# Patient Record
Sex: Female | Born: 1995 | Race: Black or African American | Hispanic: No | Marital: Single | Smoking: Never smoker
Health system: Southern US, Community
[De-identification: ages and names within clinical notes are randomized; demographics above are authoritative.]

## PROBLEM LIST (undated history)

## (undated) ENCOUNTER — Inpatient Hospital Stay (HOSPITAL_COMMUNITY): Payer: Self-pay

## (undated) DIAGNOSIS — D649 Anemia, unspecified: Secondary | ICD-10-CM

## (undated) HISTORY — PX: MOUTH SURGERY: SHX715

---
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
## 2014-08-23 ENCOUNTER — Encounter (HOSPITAL_COMMUNITY): Payer: Self-pay | Admitting: *Deleted

## 2014-08-23 ENCOUNTER — Emergency Department (HOSPITAL_COMMUNITY)
Admission: EM | Admit: 2014-08-23 | Discharge: 2014-08-24 | Disposition: A | Discharge status: Home / Self Care | Payer: Medicaid Other | Attending: Emergency Medicine | Admitting: Emergency Medicine

## 2014-08-23 DIAGNOSIS — N3090 Cystitis, unspecified without hematuria: Secondary | ICD-10-CM | POA: Diagnosis not present

## 2014-08-23 DIAGNOSIS — Z79899 Other long term (current) drug therapy: Secondary | ICD-10-CM | POA: Diagnosis not present

## 2014-08-23 DIAGNOSIS — R112 Nausea with vomiting, unspecified: Secondary | ICD-10-CM | POA: Diagnosis present

## 2014-08-23 DIAGNOSIS — Z880 Allergy status to penicillin: Secondary | ICD-10-CM | POA: Insufficient documentation

## 2014-08-23 DIAGNOSIS — D649 Anemia, unspecified: Secondary | ICD-10-CM | POA: Diagnosis not present

## 2014-08-23 DIAGNOSIS — R42 Dizziness and giddiness: Secondary | ICD-10-CM | POA: Insufficient documentation

## 2014-08-23 HISTORY — DX: Anemia, unspecified: D64.9

## 2014-08-23 NOTE — ED Notes (Signed)
Pt complains of pain in her hips radiating down to both knees. Pt also complains of nausea for the past 2 days. Pt states the nausea started after she went to the doctor 2 days ago and had blood drawn. Pt states she has thrown up 3 times today, denies diarrhea.

## 2014-08-24 LAB — CBC WITH DIFFERENTIAL/PLATELET
Basophils Absolute: 0 10*3/uL (ref 0.0–0.1)
Basophils Relative: 0 % (ref 0–1)
EOS ABS: 0.2 10*3/uL (ref 0.0–0.7)
Eosinophils Relative: 2 % (ref 0–5)
HEMATOCRIT: 39.7 % (ref 36.0–46.0)
Hemoglobin: 13.1 g/dL (ref 12.0–15.0)
LYMPHS PCT: 34 % (ref 12–46)
Lymphs Abs: 3.5 10*3/uL (ref 0.7–4.0)
MCH: 27.9 pg (ref 26.0–34.0)
MCHC: 33 g/dL (ref 30.0–36.0)
MCV: 84.5 fL (ref 78.0–100.0)
MONO ABS: 0.6 10*3/uL (ref 0.1–1.0)
Monocytes Relative: 6 % (ref 3–12)
NEUTROS ABS: 6.1 10*3/uL (ref 1.7–7.7)
Neutrophils Relative %: 58 % (ref 43–77)
Platelets: 308 10*3/uL (ref 150–400)
RBC: 4.7 MIL/uL (ref 3.87–5.11)
RDW: 12.7 % (ref 11.5–15.5)
WBC: 10.4 10*3/uL (ref 4.0–10.5)

## 2014-08-24 LAB — BASIC METABOLIC PANEL
Anion gap: 7 (ref 5–15)
BUN: 11 mg/dL (ref 6–20)
CALCIUM: 9.7 mg/dL (ref 8.9–10.3)
CO2: 26 mmol/L (ref 22–32)
CREATININE: 0.71 mg/dL (ref 0.44–1.00)
Chloride: 106 mmol/L (ref 101–111)
GFR calc Af Amer: >60 mL/min (ref >60)
Glucose, Bld: 81 mg/dL (ref 65–99)
Potassium: 3.9 mmol/L (ref 3.5–5.1)
SODIUM: 139 mmol/L (ref 135–145)

## 2014-08-24 LAB — URINE MICROSCOPIC-ADD ON

## 2014-08-24 LAB — URINALYSIS, ROUTINE W REFLEX MICROSCOPIC
BILIRUBIN URINE: NEGATIVE
GLUCOSE, UA: NEGATIVE mg/dL
Ketones, ur: NEGATIVE mg/dL
Nitrite: POSITIVE — AB
PH: 7.5 (ref 5.0–8.0)
PROTEIN: 30 mg/dL — AB
SPECIFIC GRAVITY, URINE: 1.021 (ref 1.005–1.030)
UROBILINOGEN UA: 1 mg/dL (ref 0.0–1.0)

## 2014-08-24 MED ORDER — ONDANSETRON 4 MG PO TBDP: ORAL_TABLET | ORAL | Status: DC

## 2014-08-24 MED ORDER — SODIUM CHLORIDE 0.9 % IV BOLUS (SEPSIS)
1000.0000 mL | Freq: Once | INTRAVENOUS | Status: AC
  Administered 2014-08-24: 1000 mL via INTRAVENOUS

## 2014-08-24 MED ORDER — CEPHALEXIN 500 MG PO CAPS: 500.0000 mg | ORAL_CAPSULE | Freq: Four times a day (QID) | ORAL | Status: DC

## 2014-08-24 NOTE — ED Notes (Signed)
Patient reports she has been intermittently nauseated x 5 days and began vomiting today.

## 2014-08-24 NOTE — ED Notes (Signed)
Patient eating a sandwich.  Denies nausea at this time.

## 2014-08-24 NOTE — ED Notes (Signed)
Dr. Gentry at bedside. 

## 2014-08-24 NOTE — Discharge Instructions (Signed)

## 2014-08-24 NOTE — ED Provider Notes (Signed)
CSN: [id number]     Arrival date & time 08/23/14  2240 History  This chart was scribed for Mirian Mo, MD by Evon Slack, ED Scribe. This patient was seen in room WA17/WA17 and the patient's care was started at 1:04 AM.     Chief Complaint  Patient presents with  . Leg Pain  . Nausea   Patient is a 19 y.o. female presenting with leg pain. The history is provided by the patient. No language interpreter was used.  Leg Pain Location:  Hip Time since incident:  1 day Injury: no   Hip location:  L hip and R hip Pain details:    Radiates to:  R leg and L leg   Severity:  Mild   Timing:  Constant Chronicity:  New Dislocation: no   Foreign body present:  No foreign bodies Relieved by:  None tried Associated symptoms: no fever    HPI Comments: Shannon Holland is a 19 y.o. female who presents to the Emergency Department complaining of bilateral hip pain onset today. Pt states that the pain is radiating down to her knees. Pt reports subjective fever yesterday that has resolved with OTC medication. Pt reports nausea, vomiting and light headedness. Pt reports Hx of anemia due to low iron. She states that takes an iron supplement daily but has recenlty ran out 5 days ago. Denies diarrhea, constipation, dysuria, vaginal bleeding or vaginal discharge. Pt denies any recent sick contacts.     Past Medical History  Diagnosis Date  . Anemia    History reviewed. No pertinent past surgical history. No family history on file. History  Substance Use Topics  . Smoking status: Never Smoker   . Smokeless tobacco: Not on file  . Alcohol Use: No   OB History    No data available     Review of Systems  Constitutional: Negative for fever.  Gastrointestinal: Positive for nausea and vomiting. Negative for diarrhea and constipation.  Genitourinary: Negative for dysuria, vaginal bleeding and vaginal discharge.  Musculoskeletal: Positive for arthralgias.  Neurological: Positive for  light-headedness.  All other systems reviewed and are negative.     Allergies  Shrimp and Penicillins  Home Medications   Prior to Admission medications   Medication Sig Start Date End Date Taking? Authorizing Provider  cholecalciferol (VITAMIN D) 1000 UNITS tablet Take 1,000 Units by mouth daily.   Yes Historical Provider, MD  ferrous fumarate (HEMOCYTE - 106 MG FE) 325 (106 FE) MG TABS tablet Take 1 tablet by mouth 2 (two) times daily.   Yes Historical Provider, MD  Multiple Vitamin (MULTIVITAMIN WITH MINERALS) TABS tablet Take 1 tablet by mouth daily.   Yes Historical Provider, MD  omeprazole (PRILOSEC) 20 MG capsule Take 20 mg by mouth daily.   Yes Historical Provider, MD  OVER THE COUNTER MEDICATION Take 1 tablet by mouth daily. Herbal energy   Yes Historical Provider, MD  vitamin E 100 UNIT capsule Take 100 Units by mouth daily.   Yes Historical Provider, MD  cephALEXin (KEFLEX) 500 MG capsule Take 1 capsule (500 mg total) by mouth 4 (four) times daily. 08/24/14   Mirian Mo, MD  ondansetron (ZOFRAN ODT) 4 MG disintegrating tablet  ODT q4 hours prn nausea/vomit 08/24/14   Mirian Mo, MD   BP 114/77 mmHg  Pulse 69  Temp(Src) 98.5 F (36.9 C) (Oral)  Resp 18  SpO2 100%  LMP 08/16/2014   Physical Exam  Constitutional: She is oriented to person, place, and time. She  appears well-developed and well-nourished.  HENT:  Head: Normocephalic and atraumatic.  Right Ear: External ear normal.  Left Ear: External ear normal.  Eyes: Conjunctivae and EOM are normal. Pupils are equal, round, and reactive to light.  Neck: Normal range of motion. Neck supple.  Cardiovascular: Normal rate, regular rhythm, normal heart sounds and intact distal pulses.   Pulmonary/Chest: Effort normal and breath sounds normal.  Abdominal: Soft. Bowel sounds are normal. There is no tenderness.  Musculoskeletal: Normal range of motion.  Neurological: She is alert and oriented to person, place, and  time.  Skin: Skin is warm and dry.  Vitals reviewed.   ED Course  Procedures (including critical care time) DIAGNOSTIC STUDIES: Oxygen Saturation is 100% on RA, normal by my interpretation.    COORDINATION OF CARE: 1:30 AM-Discussed treatment plan with pt at bedside and pt agreed to plan.     Labs Review Labs Reviewed  URINALYSIS, ROUTINE W REFLEX MICROSCOPIC (NOT AT [facility]) - Abnormal; Notable for the following:    APPearance CLOUDY (*)    Hgb urine dipstick LARGE (*)    Protein, ur 30 (*)    Nitrite POSITIVE (*)    Leukocytes, UA MODERATE (*)    All other components within normal limits  URINE MICROSCOPIC-ADD ON - Abnormal; Notable for the following:    Bacteria, UA MANY (*)    All other components within normal limits  CBC WITH DIFFERENTIAL/PLATELET  BASIC METABOLIC PANEL    Imaging Review No results found.   EKG Interpretation None      MDM   Final diagnoses:  Cystitis      19 y.o. female with pertinent PMH of iron deficiency anemia on iron supplementation presents with bil le myalgias, nausea and vomiting without diarrhea.  On arrival the patient has vital signs and physical exam as above. Although she states she's been vomiting today, patient has not thrown up in the emergency department. She was given Zofran and took ginger ale by mouth without difficulty. Workup as above revealed signs of urinary tract infection. Suspect the patient has a cystitis and likely developing pyelonephritis as etiology of her symptoms. She is afebrile and I feel she is stable for outpatient therapy. Given a prescription for Keflex, discharged home in stable condition with standard return precautions..    I have reviewed all laboratory and imaging studies if ordered as above  1. Cystitis           Mirian Mo, MD 08/24/14 [id number]

## 2014-10-11 ENCOUNTER — Encounter: Payer: Medicaid Other | Admitting: Obstetrics & Gynecology

## 2014-11-13 ENCOUNTER — Encounter (HOSPITAL_COMMUNITY): Payer: Self-pay | Admitting: *Deleted

## 2014-11-13 ENCOUNTER — Inpatient Hospital Stay (HOSPITAL_COMMUNITY)
Admission: AD | Admit: 2014-11-13 | Discharge: 2014-11-13 | Disposition: A | Discharge status: Home / Self Care | Payer: Medicaid Other | Source: Ambulatory Visit | Attending: Family Medicine | Admitting: Family Medicine

## 2014-11-13 DIAGNOSIS — R110 Nausea: Secondary | ICD-10-CM | POA: Diagnosis present

## 2014-11-13 DIAGNOSIS — R109 Unspecified abdominal pain: Secondary | ICD-10-CM

## 2014-11-13 DIAGNOSIS — R1030 Lower abdominal pain, unspecified: Secondary | ICD-10-CM | POA: Diagnosis present

## 2014-11-13 DIAGNOSIS — Z3A12 12 weeks gestation of pregnancy: Secondary | ICD-10-CM | POA: Diagnosis not present

## 2014-11-13 DIAGNOSIS — O26899 Other specified pregnancy related conditions, unspecified trimester: Secondary | ICD-10-CM

## 2014-11-13 DIAGNOSIS — O26891 Other specified pregnancy related conditions, first trimester: Secondary | ICD-10-CM | POA: Insufficient documentation

## 2014-11-13 DIAGNOSIS — J029 Acute pharyngitis, unspecified: Secondary | ICD-10-CM | POA: Diagnosis present

## 2014-11-13 DIAGNOSIS — Z3491 Encounter for supervision of normal pregnancy, unspecified, first trimester: Secondary | ICD-10-CM

## 2014-11-13 DIAGNOSIS — O9989 Other specified diseases and conditions complicating pregnancy, childbirth and the puerperium: Secondary | ICD-10-CM

## 2014-11-13 LAB — CBC WITH DIFFERENTIAL/PLATELET
Basophils Absolute: 0 10*3/uL (ref 0.0–0.1)
Basophils Relative: 0 %
EOS ABS: 0.2 10*3/uL (ref 0.0–0.7)
EOS PCT: 2 %
HCT: 36.4 % (ref 36.0–46.0)
HEMOGLOBIN: 12.1 g/dL (ref 12.0–15.0)
LYMPHS ABS: 1.5 10*3/uL (ref 0.7–4.0)
LYMPHS PCT: 16 %
MCH: 27.5 pg (ref 26.0–34.0)
MCHC: 33.2 g/dL (ref 30.0–36.0)
MCV: 82.7 fL (ref 78.0–100.0)
MONOS PCT: 6 %
Monocytes Absolute: 0.6 10*3/uL (ref 0.1–1.0)
Neutro Abs: 7.5 10*3/uL (ref 1.7–7.7)
Neutrophils Relative %: 76 %
PLATELETS: 297 10*3/uL (ref 150–400)
RBC: 4.4 MIL/uL (ref 3.87–5.11)
RDW: 13.2 % (ref 11.5–15.5)
WBC: 9.7 10*3/uL (ref 4.0–10.5)

## 2014-11-13 LAB — URINALYSIS, ROUTINE W REFLEX MICROSCOPIC
BILIRUBIN URINE: NEGATIVE
Glucose, UA: NEGATIVE mg/dL
HGB URINE DIPSTICK: NEGATIVE
Ketones, ur: NEGATIVE mg/dL
Nitrite: NEGATIVE
PH: 7 (ref 5.0–8.0)
Protein, ur: NEGATIVE mg/dL
SPECIFIC GRAVITY, URINE: 1.015 (ref 1.005–1.030)
UROBILINOGEN UA: 0.2 mg/dL (ref 0.0–1.0)

## 2014-11-13 LAB — WET PREP, GENITAL
CLUE CELLS WET PREP: NONE SEEN
Trich, Wet Prep: NONE SEEN
Yeast Wet Prep HPF POC: NONE SEEN

## 2014-11-13 LAB — URINE MICROSCOPIC-ADD ON

## 2014-11-13 LAB — RAPID STREP SCREEN (MED CTR MEBANE ONLY): Streptococcus, Group A Screen (Direct): NEGATIVE

## 2014-11-13 NOTE — Discharge Instructions (Signed)
Abdominal Pain During Pregnancy Belly (abdominal) pain is common during pregnancy. Most of the time, it is not a serious problem. Other times, it can be a sign that something is wrong with the pregnancy. Always tell your doctor if you have belly pain. HOME CARE Monitor your belly pain for any changes. The following actions may help you feel better:  Do not have sex (intercourse) or put anything in your vagina until you feel better.  Rest until your pain stops.  Drink clear fluids if you feel sick to your stomach (nauseous). Do not eat solid food until you feel better.  Only take medicine as told by your doctor.  Keep all doctor visits as told. GET HELP RIGHT AWAY IF:   You are bleeding, leaking fluid, or pieces of tissue come out of your vagina.  You have more pain or cramping.  You keep throwing up (vomiting).  You have pain when you pee (urinate) or have blood in your pee.  You have a fever.  You do not feel your baby moving as much.  You feel very weak or feel like passing out.  You have trouble breathing, with or without belly pain.  You have a very bad headache and belly pain.  You have fluid leaking from your vagina and belly pain.  You keep having watery poop (diarrhea).  Your belly pain does not go away after resting, or the pain gets worse. MAKE SURE YOU:   Understand these instructions.  Will watch your condition.  Will get help right away if you are not doing well or get worse.   This information is not intended to replace advice given to you by your health care provider. Make sure you discuss any questions you have with your health care provider.   Document Released: 01/08/2009 Document Revised: 09/22/2012 Document Reviewed: 08/19/2012 Elsevier Interactive Patient Education 2016 [employer]  Document Released: 10/30/2004 Document Revised: 10/11/2014 Document Reviewed: 09/29/2012 Elsevier Interactive Patient Education 2016 Elsevier  Inc. Pharyngitis Pharyngitis is a sore throat (pharynx). There is redness, pain, and swelling of your throat. HOME CARE   Drink enough fluids to keep your pee (urine) clear or pale yellow.  Only take medicine as told by your doctor.  You may get sick again if you do not take medicine as told. Finish your medicines, even if you start to feel better.  Do not take aspirin.  Rest.  Rinse your mouth (gargle) with salt water ( tsp of salt per 1 qt of water) every 1-2 hours. This will help the pain.  If you are not at risk for choking, you can suck on hard candy or sore throat lozenges. GET HELP IF:  You have large, tender lumps on your neck.  You have a rash.  You cough up green, yellow-brown, or bloody spit. GET HELP RIGHT AWAY IF:   You have a stiff neck.  You drool or cannot swallow liquids.  You throw up (vomit) or are not able to keep medicine or liquids down.  You have very bad pain that does not go away with medicine.  You have problems breathing (not from a stuffy nose). MAKE SURE YOU:   Understand these instructions.  Will watch your condition.  Will get help right away if you are not doing well or get worse.   This information is not intended to replace advice given to you by your health care provider. Make sure you discuss any questions you have with your health care provider.  Document Released: 07/09/2007 Document Revised: 11/10/2012 Document Reviewed: 09/27/2012 Elsevier Interactive Patient Education 2016 [employer]. WE Will call you when Rapid Throat culture has resulted

## 2014-11-13 NOTE — MAU Provider Note (Signed)
History     CSN: [id number]  Arrival date and time: 11/13/14 8295   None     Chief Complaint  Patient presents with  . Abdominal Pain  . Nausea  . Sore Throat   HPI Shannon Holland is 19 y.o. G1P0 [redacted]w[redacted]d weeks presenting with intermittent lower abdominal pain that began am.  Began as cramping now more uncomfortable.  Rates as 7/10 at its worse and at present 6/10.  Tried warm compress that only helped a little.  Neg for vaginal bleeding.  Also has a sore throat that began 10 days ago. Hurts more in the am, warm gargles and throat lozenges help. Neg for fever.  Saw PCP but told couldn't treat her because she was pregnant, culture not done.    Past Medical History  Diagnosis Date  . Anemia     Past Surgical History  Procedure Laterality Date  . No past surgeries      History reviewed. No pertinent family history.  Social History  Substance Use Topics  . Smoking status: Never Smoker   . Smokeless tobacco: None  . Alcohol Use: No    Allergies:  Allergies  Allergen Reactions  . Shrimp [Shellfish Allergy] Anaphylaxis  . Penicillins Rash    Has patient had a PCN reaction causing immediate rash, facial/tongue/throat swelling, SOB or lightheadedness with hypotension: Yes Has patient had a PCN reaction causing severe rash involving mucus membranes or skin necrosis: No Has patient had a PCN reaction that required hospitalization No Has patient had a PCN reaction occurring within the last 10 years: Yes If all of the above answers are "NO", then may proceed with Cephalosporin use.     No prescriptions prior to admission    Review of Systems  Constitutional: Negative for fever and chills.  HENT: Positive for sore throat.   Respiratory: Negative for cough and shortness of breath.   Cardiovascular:       Chest tightness off and on without SOB, cough  Gastrointestinal: Positive for abdominal pain (right lower pain, intermittent). Negative for nausea and vomiting.   Genitourinary: Negative for dysuria, urgency, frequency and hematuria.       Neg for vaginal bleeding   Physical Exam   Blood pressure 104/59, pulse 92, temperature 98.7 F (37.1 C), temperature source Oral, resp. rate 18, height  (1.651 m), weight 115 lb (52.164 kg), last menstrual period 08/20/2014, SpO2 100 %.  Physical Exam  Nursing note and vitals reviewed. Constitutional: She is oriented to person, place, and time. She appears well-developed and well-nourished.  HENT:  Head: Normocephalic.  Neck: Normal range of motion. Neck supple.  Cardiovascular: Normal rate, regular rhythm and normal heart sounds.   Respiratory: Effort normal and breath sounds normal. No respiratory distress. She has no wheezes. She has no rales. She exhibits no tenderness.  GI: Soft. There is no tenderness (neg for pain on exam).  Genitourinary: There is no rash, tenderness or lesion on the right labia. There is no rash, tenderness or lesion on the left labia. Uterus is enlarged (12 week size, nontender). Uterus is not tender. Cervix exhibits no motion tenderness and no discharge. Right adnexum displays no mass, no tenderness and no fullness. Left adnexum displays no mass, no tenderness and no fullness. No erythema, tenderness or bleeding in the vagina. No vaginal discharge (mucusy) found.  FHT by doppler 156  Musculoskeletal: Normal range of motion. She exhibits no edema.  Neurological: She is alert and oriented to person, place, and time.  Skin: Skin is warm and dry.  Psychiatric: She has a normal mood and affect. Her behavior is normal. Thought content normal.   Results for orders placed or performed during the hospital encounter of 11/13/14 (from the past 24 hour(s))  Urinalysis, Routine w reflex microscopic (not at [facility])     Status: Abnormal   Collection Time: 11/13/14  9:45 AM  Result Value Ref Range   Color, Urine YELLOW YELLOW   APPearance CLEAR CLEAR   Specific Gravity, Urine 1.015 1.005 -  1.030   pH 7.0 5.0 - 8.0   Glucose, UA NEGATIVE NEGATIVE mg/dL   Hgb urine dipstick NEGATIVE NEGATIVE   Bilirubin Urine NEGATIVE NEGATIVE   Ketones, ur NEGATIVE NEGATIVE mg/dL   Protein, ur NEGATIVE NEGATIVE mg/dL   Urobilinogen, UA 0.2 0.0 - 1.0 mg/dL   Nitrite NEGATIVE NEGATIVE   Leukocytes, UA SMALL (A) NEGATIVE  Urine microscopic-add on     Status: Abnormal   Collection Time: 11/13/14  9:45 AM  Result Value Ref Range   Squamous Epithelial / LPF FEW (A) RARE   WBC, UA 0-2 <3 WBC/hpf  Rapid strep screen (not at [facility])     Status: None   Collection Time: 11/13/14 10:16 AM  Result Value Ref Range   Streptococcus, Group A Screen (Direct) NEGATIVE NEGATIVE  Wet prep, genital     Status: Abnormal   Collection Time: 11/13/14 10:16 AM  Result Value Ref Range   Yeast Wet Prep HPF POC NONE SEEN NONE SEEN   Trich, Wet Prep NONE SEEN NONE SEEN   Clue Cells Wet Prep HPF POC NONE SEEN NONE SEEN   WBC, Wet Prep HPF POC FEW (A) NONE SEEN  CBC WITH DIFFERENTIAL     Status: None   Collection Time: 11/13/14 10:45 AM  Result Value Ref Range   WBC 9.7 4.0 - 10.5 K/uL   RBC 4.40 3.87 - 5.11 MIL/uL   Hemoglobin 12.1 12.0 - 15.0 g/dL   HCT 16.1 09.6 - 04.5 %   MCV 82.7 78.0 - 100.0 fL   MCH 27.5 26.0 - 34.0 pg   MCHC 33.2 30.0 - 36.0 g/dL   RDW 40.9 81.1 - 91.4 %   Platelets 297 150 - 400 K/uL   Neutrophils Relative % 76 %   Neutro Abs 7.5 1.7 - 7.7 K/uL   Lymphocytes Relative 16 %   Lymphs Abs 1.5 0.7 - 4.0 K/uL   Monocytes Relative 6 %   Monocytes Absolute 0.6 0.1 - 1.0 K/uL   Eosinophils Relative 2 %   Eosinophils Absolute 0.2 0.0 - 0.7 K/uL   Basophils Relative 0 %   Basophils Absolute 0.0 0.0 - 0.1 K/uL    MAU Course  Procedures  GC/CHL, Rapid throat cult and HIV pending  MDM: MSE Labs Exam   Assessment and Plan  A:  Lower abdominal pain      [redacted]w[redacted]d gestation by LMP-viable by + FHT by doppler       Sore throat--Negative Rapid Strept  P: Continue throat lozenges       Encouraged her to begin prenatal care with MD of choice or to call Clinic at [facility] to begin prenatal care      I called patient and reported Neg Rapid Strept Test       Opha Mcghee,EVE M 11/13/2014, 12:23 PM

## 2014-11-13 NOTE — MAU Note (Signed)
Pt presents to MAU with complaints of lower abdominal pain, nausea and a sore throat for a week. Denies any vaginal bleeding or abnormal discharge

## 2014-11-14 LAB — GC/CHLAMYDIA PROBE AMP (~~LOC~~) NOT AT ARMC
Chlamydia: NEGATIVE
NEISSERIA GONORRHEA: NEGATIVE

## 2014-11-14 LAB — HIV ANTIBODY (ROUTINE TESTING W REFLEX): HIV Screen 4th Generation wRfx: NONREACTIVE

## 2014-11-18 LAB — CULTURE, GROUP A STREP: STREP A CULTURE: NEGATIVE

## 2014-12-18 LAB — OB RESULTS CONSOLE ANTIBODY SCREEN: Antibody Screen: NEGATIVE

## 2014-12-18 LAB — OB RESULTS CONSOLE ABO/RH: RH TYPE: POSITIVE

## 2014-12-18 LAB — OB RESULTS CONSOLE HIV ANTIBODY (ROUTINE TESTING): HIV: NONREACTIVE

## 2014-12-18 LAB — OB RESULTS CONSOLE RPR: RPR: NONREACTIVE

## 2014-12-18 LAB — OB RESULTS CONSOLE GC/CHLAMYDIA
Chlamydia: NEGATIVE
GC PROBE AMP, GENITAL: NEGATIVE

## 2014-12-18 LAB — OB RESULTS CONSOLE RUBELLA ANTIBODY, IGM: RUBELLA: IMMUNE

## 2014-12-18 LAB — OB RESULTS CONSOLE HEPATITIS B SURFACE ANTIGEN: HEP B S AG: NEGATIVE

## 2015-01-05 ENCOUNTER — Encounter (HOSPITAL_COMMUNITY): Payer: Self-pay

## 2015-01-05 ENCOUNTER — Inpatient Hospital Stay (HOSPITAL_COMMUNITY)
Admission: AD | Admit: 2015-01-05 | Discharge: 2015-01-05 | Disposition: A | Discharge status: Home / Self Care | Payer: Medicaid Other | Source: Ambulatory Visit | Attending: Obstetrics & Gynecology | Admitting: Obstetrics & Gynecology

## 2015-01-05 DIAGNOSIS — B373 Candidiasis of vulva and vagina: Secondary | ICD-10-CM

## 2015-01-05 DIAGNOSIS — B3731 Acute candidiasis of vulva and vagina: Secondary | ICD-10-CM

## 2015-01-05 DIAGNOSIS — Z3A19 19 weeks gestation of pregnancy: Secondary | ICD-10-CM | POA: Diagnosis not present

## 2015-01-05 DIAGNOSIS — M545 Low back pain, unspecified: Secondary | ICD-10-CM

## 2015-01-05 DIAGNOSIS — Z880 Allergy status to penicillin: Secondary | ICD-10-CM | POA: Diagnosis not present

## 2015-01-05 DIAGNOSIS — O98812 Other maternal infectious and parasitic diseases complicating pregnancy, second trimester: Secondary | ICD-10-CM | POA: Insufficient documentation

## 2015-01-05 DIAGNOSIS — O368120 Decreased fetal movements, second trimester, not applicable or unspecified: Secondary | ICD-10-CM | POA: Insufficient documentation

## 2015-01-05 LAB — URINALYSIS, ROUTINE W REFLEX MICROSCOPIC
Bilirubin Urine: NEGATIVE
GLUCOSE, UA: NEGATIVE mg/dL
Hgb urine dipstick: NEGATIVE
Ketones, ur: NEGATIVE mg/dL
Nitrite: NEGATIVE
PH: 7 (ref 5.0–8.0)
PROTEIN: NEGATIVE mg/dL
Specific Gravity, Urine: 1.01 (ref 1.005–1.030)

## 2015-01-05 LAB — WET PREP, GENITAL
CLUE CELLS WET PREP: NONE SEEN
Sperm: NONE SEEN
Trich, Wet Prep: NONE SEEN

## 2015-01-05 LAB — URINE MICROSCOPIC-ADD ON: RBC / HPF: NONE SEEN RBC/hpf (ref 0–5)

## 2015-01-05 MED ORDER — TERCONAZOLE 0.8 % VA CREA: 1.0000 | TOPICAL_CREAM | Freq: Every day | VAGINAL | Status: DC

## 2015-01-05 MED ORDER — CYCLOBENZAPRINE HCL 10 MG PO TABS: 10.0000 mg | ORAL_TABLET | Freq: Two times a day (BID) | ORAL | Status: DC | PRN

## 2015-01-05 MED ORDER — ACETAMINOPHEN 325 MG PO TABS
650.0000 mg | ORAL_TABLET | Freq: Once | ORAL | Status: AC
  Administered 2015-01-05: 650 mg via ORAL
  Filled 2015-01-05: qty 2
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
## 2015-01-05 NOTE — Discharge Instructions (Signed)
Back Pain in Pregnancy °Back pain during pregnancy is common. It happens in about half of all pregnancies. It is important for you and your baby that you remain active during your pregnancy. If you feel that back pain is not allowing you to remain active or sleep well, it is time to see your caregiver. Back pain may be caused by several factors related to changes during your pregnancy. Fortunately, unless you had trouble with your back before your pregnancy, the pain is likely to get better after you deliver. °Low back pain usually occurs between the fifth and seventh months of pregnancy. It can, however, happen in the first couple months. Factors that increase the risk of back problems include:  °· Previous back problems. °· Injury to your back. °· Having twins or multiple births. °· A chronic cough. °· Stress. °· Job-related repetitive motions. °· Muscle or spinal disease in the back. °· Family history of back problems, ruptured (herniated) discs, or osteoporosis. °· Depression, anxiety, and panic attacks. °CAUSES  °· When you are pregnant, your body produces a hormone called relaxin. This hormone makes the ligaments connecting the low back and pubic bones more flexible. This flexibility allows the baby to be delivered more easily. When your ligaments are loose, your muscles need to work harder to support your back. Soreness in your back can come from tired muscles. Soreness can also come from back tissues that are irritated since they are receiving less support. °· As the baby grows, it puts pressure on the nerves and blood vessels in your pelvis. This can cause back pain. °· As the baby grows and gets heavier during pregnancy, the uterus pushes the stomach muscles forward and changes your center of gravity. This makes your back muscles work harder to maintain good posture. °SYMPTOMS  °Lumbar pain during pregnancy °Lumbar pain during pregnancy usually occurs at or above the waist in the center of the back. There  may be pain and numbness that radiates into your leg or foot. This is similar to low back pain experienced by non-pregnant women. It usually increases with sitting for long periods of time, standing, or repetitive lifting. Tenderness may also be present in the muscles along your upper back. °Posterior pelvic pain during pregnancy °Pain in the back of the pelvis is more common than lumbar pain in pregnancy. It is a deep pain felt in your side at the waistline, or across the tailbone (sacrum), or in both places. You may have pain on one or both sides. This pain can also go into the buttocks and backs of the upper thighs. Pubic and groin pain may also be present. The pain does not quickly resolve with rest, and morning stiffness may also be present. °Pelvic pain during pregnancy can be brought on by most activities. A high level of fitness before and during pregnancy may or may not prevent this problem. Labor pain is usually 1 to 2 minutes apart, lasts for about 1 minute, and involves a bearing down feeling or pressure in your pelvis. However, if you are at term with the pregnancy, constant low back pain can be the beginning of early labor, and you should be aware of this. °DIAGNOSIS  °X-rays of the back should not be done during the first 12 to 14 weeks of the pregnancy and only when absolutely necessary during the rest of the pregnancy. MRIs do not give off radiation and are safe during pregnancy. MRIs also should only be done when absolutely necessary. °HOME CARE INSTRUCTIONS °· Exercise   as directed by your caregiver. Exercise is the most effective way to prevent or manage back pain. If you have a back problem, it is especially important to avoid sports that require sudden body movements. Swimming and walking are great activities. °· Do not stand in one place for long periods of time. °· Do not wear high heels. °· Sit in chairs with good posture. Use a pillow on your lower back if necessary. Make sure your head  rests over your shoulders and is not hanging forward. °· Try sleeping on your side, preferably the left side, with a pillow or two between your legs. If you are sore after a night's rest, your bed may be too soft. Try placing a board between your mattress and box spring. °· Listen to your body when lifting. If you are experiencing pain, ask for help or try bending your knees more so you can use your leg muscles rather than your back muscles. Squat down when picking up something from the floor. Do not bend over. °· Eat a healthy diet. Try to gain weight within your caregiver's recommendations. °· Use heat or cold packs 3 to 4 times a day for 15 minutes to help with the pain. °· Only take over-the-counter or prescription medicines for pain, discomfort, or fever as directed by your caregiver. °Sudden (acute) back pain °· Use bed rest for only the most extreme, acute episodes of back pain. Prolonged bed rest over 48 hours will aggravate your condition. °· Ice is very effective for acute conditions. °¨ Put ice in a plastic bag. °¨ Place a towel between your skin and the bag. °¨ Leave the ice on for 10 to 20 minutes every 2 hours, or as needed. °· Using heat packs for 30 minutes prior to activities is also helpful. °Continued back pain °See your caregiver if you have continued problems. Your caregiver can help or refer you for appropriate physical therapy. With conditioning, most back problems can be avoided. Sometimes, a more serious issue may be the cause of back pain. You should be seen right away if new problems seem to be developing. Your caregiver may recommend: °· A maternity girdle. °· An elastic sling. °· A back brace. °· A massage therapist or acupuncture. °SEEK MEDICAL CARE IF:  °· You are not able to do most of your daily activities, even when taking the pain medicine you were given. °· You need a referral to a physical therapist or chiropractor. °· You want to try acupuncture. °SEEK IMMEDIATE MEDICAL CARE  IF: °· You develop numbness, tingling, weakness, or problems with the use of your arms or legs. °· You develop severe back pain that is no longer relieved with medicines. °· You have a sudden change in bowel or bladder control. °· You have increasing pain in other areas of the body. °· You develop shortness of breath, dizziness, or fainting. °· You develop nausea, vomiting, or sweating. °· You have back pain which is similar to labor pains. °· You have back pain along with your water breaking or vaginal bleeding. °· You have back pain or numbness that travels down your leg. °· Your back pain developed after you fell. °· You develop pain on one side of your back. You may have a kidney stone. °· You see blood in your urine. You may have a bladder infection or kidney stone. °· You have back pain with blisters. You may have shingles. °Back pain is fairly common during pregnancy but should not be accepted as just part of   the process. Back pain should always be treated as soon as possible. This will make your pregnancy as pleasant as possible.   This information is not intended to replace advice given to you by your health care provider. Make sure you discuss any questions you have with your health care provider.   Document Released: 04/30/2005 Document Revised: 04/14/2011 Document Reviewed: 06/11/2010 Elsevier Interactive Patient Education 2016 Elsevier Inc. Monilial Vaginitis Vaginitis in a soreness, swelling and redness (inflammation) of the vagina and vulva. Monilial vaginitis is not a sexually transmitted infection. CAUSES  Yeast vaginitis is caused by yeast (candida) that is normally found in your vagina. With a yeast infection, the candida has overgrown in number to a point that upsets the chemical balance. SYMPTOMS   White, thick vaginal discharge.  Swelling, itching, redness and irritation of the vagina and possibly the lips of the vagina (vulva).  Burning or painful urination.  Painful  intercourse. DIAGNOSIS  Things that may contribute to monilial vaginitis are:  Postmenopausal and virginal states.  Pregnancy.  Infections.  Being tired, sick or stressed, especially if you had monilial vaginitis in the past.  Diabetes. Good control will help lower the chance.  Birth control pills.  Tight fitting garments.  Using bubble bath, feminine sprays, douches or deodorant tampons.  Taking certain medications that kill germs (antibiotics).  Sporadic recurrence can occur if you become ill. TREATMENT  Your caregiver will give you medication.  There are several kinds of anti monilial vaginal creams and suppositories specific for monilial vaginitis. For recurrent yeast infections, use a suppository or cream in the vagina 2 times a week, or as directed.  Anti-monilial or steroid cream for the itching or irritation of the vulva may also be used. Get your caregiver's permission.  Painting the vagina with methylene blue solution may help if the monilial cream does not work.  Eating yogurt may help prevent monilial vaginitis. HOME CARE INSTRUCTIONS   Finish all medication as prescribed.  Do not have sex until treatment is completed or after your caregiver tells you it is okay.  Take warm sitz baths.  Do not douche.  Do not use tampons, especially scented ones.  Wear cotton underwear.  Avoid tight pants and panty hose.  Tell your sexual partner that you have a yeast infection. They should go to their caregiver if they have symptoms such as mild rash or itching.  Your sexual partner should be treated as well if your infection is difficult to eliminate.  Practice safer sex. Use condoms.  Some vaginal medications cause latex condoms to fail. Vaginal medications that harm condoms are:  Cleocin cream.  Butoconazole (Femstat).  Terconazole (Terazol) vaginal suppository.  Miconazole (Monistat) (may be purchased over the counter). SEEK MEDICAL CARE IF:   You  have a temperature by mouth above 102 F (38.9 C).  The infection is getting worse after 2 days of treatment.  The infection is not getting better after 3 days of treatment.  You develop blisters in or around your vagina.  You develop vaginal bleeding, and it is not your menstrual period.  You have pain when you urinate.  You develop intestinal problems.  You have pain with sexual intercourse.   This information is not intended to replace advice given to you by your health care provider. Make sure you discuss any questions you have with your health care provider.   Document Released: 10/30/2004 Document Revised: 04/14/2011 Document Reviewed: 07/24/2014 Elsevier Interactive Patient Education [employer]2016 Elsevier Inc.  Safe Medications in Pregnancy   Acne: Benzoyl Peroxide Salicylic Acid  Backache/Headache: Tylenol: 2 regular strength every 4 hours OR              2 Extra strength every 6 hours  Colds/Coughs/Allergies: Benadryl (alcohol free) 25 mg every 6 hours as needed Breath right strips Claritin Cepacol throat lozenges Chloraseptic throat spray Cold-Eeze- up to three times per day Cough drops, alcohol free Flonase (by prescription only) Guaifenesin Mucinex Robitussin DM (plain only, alcohol free) Saline nasal spray/drops Sudafed (pseudoephedrine) & Actifed ** use only after [redacted] weeks gestation and if you do not have high blood pressure Tylenol Vicks Vaporub Zinc lozenges Zyrtec   Constipation: Colace Ducolax suppositories Fleet enema Glycerin suppositories Metamucil Milk of magnesia Miralax Senokot Smooth move tea  Diarrhea: Kaopectate Imodium A-D  *NO pepto Bismol  Hemorrhoids: Anusol Anusol HC Preparation H Tucks  Indigestion: Tums Maalox Mylanta Zantac  Pepcid  Insomnia: Benadryl (alcohol free)  every 6 hours as needed Tylenol PM Unisom, no Gelcaps  Leg Cramps: Tums MagGel  Nausea/Vomiting:   Bonine Dramamine Emetrol Ginger extract Sea bands Meclizine  Nausea medication to take during pregnancy:  Unisom (doxylamine succinate 25 mg tablets) Take one tablet daily at bedtime. If symptoms are not adequately controlled, the dose can be increased to a maximum recommended dose of two tablets daily (1/2 tablet in the morning, 1/2 tablet mid-afternoon and one at bedtime). Vitamin B6  tablets. Take one tablet twice a day (up to 200 mg per day).  Skin Rashes: Aveeno products Benadryl cream or  every 6 hours as needed Calamine Lotion 1% cortisone cream  Yeast infection: Gyne-lotrimin 7 Monistat 7   **If taking multiple medications, please check labels to avoid duplicating the same active ingredients **take medication as directed on the label ** Do not exceed 4000 mg of tylenol in 24 hours **Do not take medications that contain aspirin or ibuprofen

## 2015-02-04 NOTE — L&D Delivery Note (Cosign Needed)
Delivery Note At 5:32 AM a viable and healthy female was delivered via Vaginal, Spontaneous Delivery (Presentation: Left Occiput Anterior).  APGAR: 9, 9; weight  .   Placenta status: Intact, Spontaneous.  Cord: 3 vessels with the following complications: None.    Anesthesia: None  Episiotomy: None Lacerations: 2nd degree Suture Repair: 3.0 monocryl Est. Blood Loss (mL):    Mom to postpartum.  Baby to Couplet care / Skin to Skin.  [facility],Hiliary Osorto 05/20/2015, 5:57 AM

## 2015-05-03 LAB — OB RESULTS CONSOLE GC/CHLAMYDIA
CHLAMYDIA, DNA PROBE: NEGATIVE
Gonorrhea: NEGATIVE

## 2015-05-03 LAB — OB RESULTS CONSOLE GBS: GBS: POSITIVE

## 2015-05-20 ENCOUNTER — Encounter (HOSPITAL_COMMUNITY): Payer: Self-pay | Admitting: *Deleted

## 2015-05-20 ENCOUNTER — Inpatient Hospital Stay (HOSPITAL_COMMUNITY)
Admission: AD | Admit: 2015-05-20 | Discharge: 2015-05-22 | DRG: 775 | Disposition: A | Discharge status: Home / Self Care | Payer: Medicaid Other | Source: Ambulatory Visit | Attending: Family Medicine | Admitting: Family Medicine

## 2015-05-20 DIAGNOSIS — B951 Streptococcus, group B, as the cause of diseases classified elsewhere: Secondary | ICD-10-CM

## 2015-05-20 DIAGNOSIS — Z3A39 39 weeks gestation of pregnancy: Secondary | ICD-10-CM

## 2015-05-20 DIAGNOSIS — O99824 Streptococcus B carrier state complicating childbirth: Secondary | ICD-10-CM | POA: Diagnosis not present

## 2015-05-20 LAB — CBC
HCT: 35.5 % — ABNORMAL LOW (ref 36.0–46.0)
Hemoglobin: 12.3 g/dL (ref 12.0–15.0)
MCH: 28.3 pg (ref 26.0–34.0)
MCHC: 34.6 g/dL (ref 30.0–36.0)
MCV: 81.6 fL (ref 78.0–100.0)
PLATELETS: 218 10*3/uL (ref 150–400)
RBC: 4.35 MIL/uL (ref 3.87–5.11)
RDW: 13.3 % (ref 11.5–15.5)
WBC: 17 10*3/uL — AB (ref 4.0–10.5)

## 2015-05-20 LAB — TYPE AND SCREEN
ABO/RH(D): B POS
ANTIBODY SCREEN: NEGATIVE

## 2015-05-20 LAB — RPR: RPR: NONREACTIVE

## 2015-05-20 LAB — ABO/RH: ABO/RH(D): B POS

## 2015-05-20 MED ORDER — OXYTOCIN BOLUS FROM INFUSION
500.0000 mL | INTRAVENOUS | Status: DC
  Administered 2015-05-20: 500 mL via INTRAVENOUS

## 2015-05-20 MED ORDER — OXYCODONE-ACETAMINOPHEN 5-325 MG PO TABS
1.0000 | ORAL_TABLET | ORAL | Status: DC | PRN
  Administered 2015-05-20: 1 via ORAL
  Filled 2015-05-20: qty 1

## 2015-05-20 MED ORDER — ZOLPIDEM TARTRATE 5 MG PO TABS: 5.0000 mg | ORAL_TABLET | Freq: Every evening | ORAL | Status: DC | PRN

## 2015-05-20 MED ORDER — PRENATAL MULTIVITAMIN CH
1.0000 | ORAL_TABLET | Freq: Every day | ORAL | Status: DC
  Administered 2015-05-21 – 2015-05-22 (×2): 1 via ORAL
  Filled 2015-05-20 (×2): qty 1

## 2015-05-20 MED ORDER — SENNOSIDES-DOCUSATE SODIUM 8.6-50 MG PO TABS
2.0000 | ORAL_TABLET | ORAL | Status: DC
  Administered 2015-05-21 – 2015-05-22 (×2): 2 via ORAL
  Filled 2015-05-20 (×2): qty 2

## 2015-05-20 MED ORDER — FLEET ENEMA 7-19 GM/118ML RE ENEM: 1.0000 | ENEMA | RECTAL | Status: DC | PRN

## 2015-05-20 MED ORDER — ACETAMINOPHEN 325 MG PO TABS: 650.0000 mg | ORAL_TABLET | ORAL | Status: DC | PRN

## 2015-05-20 MED ORDER — WITCH HAZEL-GLYCERIN EX PADS: 1.0000 "application " | MEDICATED_PAD | CUTANEOUS | Status: DC | PRN

## 2015-05-20 MED ORDER — OXYCODONE-ACETAMINOPHEN 5-325 MG PO TABS: 2.0000 | ORAL_TABLET | ORAL | Status: DC | PRN

## 2015-05-20 MED ORDER — CITRIC ACID-SODIUM CITRATE 334-500 MG/5ML PO SOLN: 30.0000 mL | ORAL | Status: DC | PRN

## 2015-05-20 MED ORDER — CLINDAMYCIN PHOSPHATE 900 MG/50ML IV SOLN
900.0000 mg | Freq: Three times a day (TID) | INTRAVENOUS | Status: DC
  Administered 2015-05-20: 900 mg via INTRAVENOUS
  Filled 2015-05-20 (×2): qty 50

## 2015-05-20 MED ORDER — FENTANYL CITRATE (PF) 100 MCG/2ML IJ SOLN
50.0000 ug | INTRAMUSCULAR | Status: DC | PRN
  Filled 2015-05-20: qty 2

## 2015-05-20 MED ORDER — SIMETHICONE 80 MG PO CHEW: 80.0000 mg | CHEWABLE_TABLET | ORAL | Status: DC | PRN

## 2015-05-20 MED ORDER — COCONUT OIL OIL
1.0000 "application " | TOPICAL_OIL | Status: DC | PRN
  Filled 2015-05-20: qty 120

## 2015-05-20 MED ORDER — ONDANSETRON HCL 4 MG PO TABS: 4.0000 mg | ORAL_TABLET | ORAL | Status: DC | PRN

## 2015-05-20 MED ORDER — LACTATED RINGERS IV SOLN
2.5000 [IU]/h | INTRAVENOUS | Status: DC
  Filled 2015-05-20: qty 4

## 2015-05-20 MED ORDER — DIPHENHYDRAMINE HCL 25 MG PO CAPS: 25.0000 mg | ORAL_CAPSULE | Freq: Four times a day (QID) | ORAL | Status: DC | PRN

## 2015-05-20 MED ORDER — TETANUS-DIPHTH-ACELL PERTUSSIS 5-2.5-18.5 LF-MCG/0.5 IM SUSP: 0.5000 mL | Freq: Once | INTRAMUSCULAR | Status: DC

## 2015-05-20 MED ORDER — BENZOCAINE-MENTHOL 20-0.5 % EX AERO
1.0000 "application " | INHALATION_SPRAY | CUTANEOUS | Status: DC | PRN
  Administered 2015-05-21: 1 via TOPICAL
  Filled 2015-05-20: qty 56

## 2015-05-20 MED ORDER — DIBUCAINE 1 % RE OINT: 1.0000 "application " | TOPICAL_OINTMENT | RECTAL | Status: DC | PRN

## 2015-05-20 MED ORDER — LACTATED RINGERS IV SOLN: INTRAVENOUS | Status: DC

## 2015-05-20 MED ORDER — LIDOCAINE HCL (PF) 1 % IJ SOLN
30.0000 mL | INTRAMUSCULAR | Status: DC | PRN
  Administered 2015-05-20: 30 mL via SUBCUTANEOUS
  Filled 2015-05-20: qty 30

## 2015-05-20 MED ORDER — ONDANSETRON HCL 4 MG/2ML IJ SOLN: 4.0000 mg | Freq: Four times a day (QID) | INTRAMUSCULAR | Status: DC | PRN

## 2015-05-20 MED ORDER — LACTATED RINGERS IV SOLN
500.0000 mL | INTRAVENOUS | Status: DC | PRN
  Administered 2015-05-20: 1000 mL via INTRAVENOUS

## 2015-05-20 MED ORDER — IBUPROFEN 600 MG PO TABS
600.0000 mg | ORAL_TABLET | Freq: Four times a day (QID) | ORAL | Status: DC
  Administered 2015-05-20 – 2015-05-22 (×10): 600 mg via ORAL
  Filled 2015-05-20 (×10): qty 1

## 2015-05-20 MED ORDER — ONDANSETRON HCL 4 MG/2ML IJ SOLN: 4.0000 mg | INTRAMUSCULAR | Status: DC | PRN

## 2015-05-20 NOTE — MAU Note (Signed)
Received from EMS.  Been contracting for the last 6 days, became stronger at 0130.  No leaking.  Blood tinge show small amt when got to hospital.

## 2015-05-20 NOTE — H&P (Signed)
Shannon Holland is a 20 y.o. female presenting for contractions which started at 0130. States she feels the need to push.  Very uncomfortable, Wants epidural . Maternal Medical History:  Reason for admission: Contractions.  Nausea.  Contractions: Onset was 3-5 hours ago.   Started 0130  Fetal activity: Perceived fetal activity is normal.   Last perceived fetal movement was within the past hour.    Prenatal complications: Bleeding.   No HIV, PIH, placental abnormality, preterm labor, substance abuse or thrombocytopenia.   Prenatal Complications - Diabetes: none. Glucola 140 but passed 3 hr GTT  OB History    Gravida Para Term Preterm AB TAB SAB Ectopic Multiple Living   1         0     Past Medical History  Diagnosis Date  . Anemia    Past Surgical History  Procedure Laterality Date  . Mouth surgery     Family History: family history is not on file. Social History:  reports that she has never smoked. She does not have any smokeless tobacco history on file. She reports that she does not drink alcohol or use illicit drugs.   Prenatal Transfer Tool  Maternal Diabetes: No  Glucola 140, but 3 hr test normal Genetic Screening: Normal Maternal Ultrasounds/Referrals: Normal Fetal Ultrasounds or other Referrals:  None Maternal Substance Abuse:  No Significant Maternal Medications:  None Significant Maternal Lab Results:  None except GBS + Other Comments:  None  Review of Systems  Constitutional: Negative for fever, chills and malaise/fatigue.  Gastrointestinal: Positive for abdominal pain. Negative for nausea, vomiting, diarrhea and constipation.  Genitourinary: Negative for dysuria.  Musculoskeletal: Positive for back pain.  Neurological: Negative for dizziness and weakness.    Dilation: 10 Effacement (%): 90 Station: -1 Exam by:: Wynelle BourgeoisMarie Williams CNM Pulse 89, last menstrual period 08/20/2014, SpO2 100 %. Maternal Exam:  Uterine Assessment: Contraction strength  is firm.  Contraction frequency is regular.   Abdomen: Patient reports no abdominal tenderness. Fundal height is 36.   Estimated fetal weight is 6.   Fetal presentation: vertex  Introitus: Normal vulva. Vagina is positive for vaginal discharge.  Pelvis: adequate for delivery.   Cervix: Cervix evaluated by digital exam.     Fetal Exam Fetal Monitor Review: Mode: ultrasound.   Baseline rate: 130.  Variability: moderate (6-25 bpm).   Pattern: accelerations present and no decelerations.    Fetal State Assessment: Category I - tracings are normal.     Physical Exam  Constitutional: She is oriented to person, place, and time. She appears well-developed and well-nourished. No distress.  HENT:  Head: Normocephalic.  Cardiovascular: Normal rate and regular rhythm.   Respiratory: Effort normal. No respiratory distress.  GI: Soft. She exhibits no distension and no mass. There is no tenderness. There is no rebound and no guarding.  Genitourinary: Vaginal discharge found.  9/100/0/BBOW/vertex  Musculoskeletal: Normal range of motion.  Neurological: She is alert and oriented to person, place, and time.  Skin: Skin is warm and dry.  Psychiatric: She has a normal mood and affect.    Prenatal labs: ABO, Rh: --/--/B POS (04/16 0433) Antibody: NEG (04/16 0433) Rubella: Immune (11/14 0000) RPR: Nonreactive (11/14 0000)  HBsAg: Negative (11/14 0000)  HIV: Non-reactive (11/14 0000)  GBS: Positive (03/30 0000)   Assessment/Plan: [facility]UP at 7181w0d  Active Labor, Transition  [facility] for delivery Will try using Nitrous Oxide for analgesia   [facility]ILL[facility],MARIE 05/20/2015, 6:02 AM

## 2015-05-20 NOTE — Lactation Note (Signed)
This note was copied from a baby's chart. Lactation Consultation Note Initial visit at 15 hours of age.  Mom reports a few good feedings early and then baby has been sleepy and not latching well.  Baby observed to be awake and sucking/thrusting tongue.  MOm has large breast with everted nipples.  Mom is uncomfortable with perineum soreness after delivery and is having a hard time getting comfortable.  FOB has a lot of questions about feeding cues, feeding on demand, frequency and sleep cycles.  LC answered questions and FOB was able to verbalize understanding as he had been confused about previous instructions.  LC assisted with latch in cross cradle hold then football and back to cross cradle due to moms discomfort and inexperience with positioning newborn.  LC encouraged mom to keep practicing to find what works for her and baby.  Mom is having a hard time with holding breast  and not nipple while latching baby, Lc encouraged FOB to help mom remember hand placement, but with repeated instructions mom unable to follow through at this time and prevented baby from maintaining a deep latch independently.  Baby had several strong sucking minutes.  Mom sometimes reported discomfort then re-attempted latch.  Mom requested a hand pump that was provided to her with instructions given.  [facility] resources given and discussed.  Encouraged to feed with early cues on demand.  Early newborn behavior discussed.  Hand expression demonstrated with colostrum visible. [facility] to [facility].   Mom to call for assist as needed.    Patient Name: Shannon Holland Reason for consult: Initial assessment   Maternal Data Has patient been taught Hand Expression?: Yes  Feeding Feeding Type: Breast Fed  LATCH Score/Interventions                      Lactation Tools Discussed/Used     Consult Status      Beverely RisenShoptaw, Arvella MerlesJana Lynn Holland, 8:51 PM

## 2015-05-21 MED ORDER — OXYCODONE HCL 5 MG PO TABS
5.0000 mg | ORAL_TABLET | Freq: Four times a day (QID) | ORAL | Status: DC | PRN
  Administered 2015-05-21 – 2015-05-22 (×2): 5 mg via ORAL
  Filled 2015-05-21 (×2): qty 1

## 2015-05-21 NOTE — Lactation Note (Signed)
This note was copied from a baby's chart. Lactation Consultation Note Baby will not latch d/t sleepy. Mom has taken shirt off, applied wet cloth. Baby has been gagging, not spitting anything up lately. Has no interest at this time in BF. Parents concerned and wants him to eat and ask for bottle w/formula. Attempted Similac 22 cal. Would not drink formula. Encouraged STS.instructed parents to wake baby up in at least 2 hours if hasn't cued before that if hungry.   Patient Name: Shannon Latrelle DodrillGabrielle Hashman EAVWU'JToday's Date: 05/21/2015 Reason for consult: Follow-up assessment;Difficult latch   Maternal Data    Feeding Feeding Type: Breast Fed Nipple Type: Slow - flow Length of feed: 0 min  LATCH Score/Interventions Latch: Too sleepy or reluctant, no latch achieved, no sucking elicited. Intervention(s): Waking techniques;Teach feeding cues  Audible Swallowing: None Intervention(s): Hand expression  Type of Nipple: Everted at rest and after stimulation  Comfort (Breast/Nipple): Soft / non-tender     Hold (Positioning): Assistance needed to correctly position infant at breast and maintain latch. Intervention(s): Breastfeeding basics reviewed;Support Pillows;Position options;Skin to skin  LATCH Score: 5  Lactation Tools Discussed/Used     Consult Status Consult Status: Follow-up Date: 05/21/15 Follow-up type: In-patient    Charyl DancerCARVER, Lekeshia Kram G 05/21/2015, 5:40 AM

## 2015-05-21 NOTE — Progress Notes (Signed)
Called the Attending (Doctor) to check mom 's uterus and check the perineum area which has tissue protruding from vaginal area. No further orders given.

## 2015-05-21 NOTE — Progress Notes (Signed)
UR chart review completed.  

## 2015-05-21 NOTE — Progress Notes (Signed)
Subjective:  Shannon Holland is a 20 y.o. G1P1001 779w0d s/p SVD.  No acute events overnight.  Pt denies problems with ambulating, voiding or po intake.  She denies nausea or vomiting.  Patient reports that she had a lot of pain from her 2nd degree lacerations but that it is now moderately controlled.  She has had flatus. She has not had bowel movement.  Lochia moderate.  Plan for birth control is Nexplanon.  Method of Feeding: breast.  Patient reports difficulties with getting baby to latch and feed, and that she is very concerned about his feeding and gaining weight.  Patient was reassured that baby will be followed closely by pediatricians and that lactation will continue to see her.  Patient reports that baby is being monitored by pediatricians for signs of infection due to her GBS status and lack of adequate treatment.  Patient also reports concern about "lumps" of fluid that she feels around her umbilicus, and was reassured that this is likely due to abdominal laxity.  Objective: Blood pressure 95/61, pulse 73, temperature 98.1 F (36.7 C), temperature source Oral, resp. rate 16, height 5\' 6"  (1.676 m), weight 66.225 kg (146 lb), last menstrual period 08/20/2014, SpO2 98 %, unknown if currently breastfeeding.  Physical Exam:  General: alert, cooperative and no distress Lochia:moderate flow Chest: CTAB Heart: RRR no m/r/g Abdomen: +BS, soft, slightly tender, no guarding or rebound tenderness; pt has bulge around area of umbilicus (likely diastasis recti or small hernia) Uterine Fundus: firm, about 1 cm below umbilicus DVT Evaluation: No evidence of DVT seen on physical exam; negative Homans sign. Extremities: No edema   Recent Labs  05/20/15 0433  HGB 12.3  HCT 35.5*    Assessment/Plan:  ASSESSMENT: Shannon Holland is a 20 y.o. G1P1001 4679w0d s/p SVD. -Continue to monitor voiding, stooling, lochia and pain control.   -Encourage breastfeeding and reassure mom regarding  abdominal exam  Plan for discharge tomorrow   LOS: 1 day   Jonni SangerSara Valdis Bevill 05/21/2015, 7:30 AM   LOS: 1 day   Jonni SangerSara Saide Lanuza 05/21/2015, 7:30 AM

## 2015-05-21 NOTE — Lactation Note (Signed)
This note was copied from a baby's chart. Lactation Consultation Note  Follow up visit made to assist with feeding.  Pecola LeisureBaby is now 7133 hours old and one documented feeding with several attempts.  Baby sleeping but woke easily with infant stimulation.  Baby has a tight mouth and does not open well.  Baby did latch to nipple only and unable to obtain deeper latch.  Discussed with mom the importance of pumping with DEBP every 3 hours to establish milk supply.  RN set symphony pump up and initiated.  Recommended baby receive supplementation every 3 hours with expressed breast milk and or formula.  Instructed to give 10 mls after each latch attempt.  Mom agreeable.  Encouraged to call with assist/concerns prn.  Patient Name: Shannon Latrelle DodrillGabrielle Holland NWGNF'AToday's Date: 05/21/2015 Reason for consult: Follow-up assessment   Maternal Data    Feeding Feeding Type: Breast Fed Nipple Type: Slow - flow Length of feed: 5 min  LATCH Score/Interventions Latch: Repeated attempts needed to sustain latch, nipple held in mouth throughout feeding, stimulation needed to elicit sucking reflex. Intervention(s): Teach feeding cues;Waking techniques Intervention(s): Breast compression;Breast massage;Assist with latch;Adjust position  Audible Swallowing: None  Type of Nipple: Everted at rest and after stimulation  Comfort (Breast/Nipple): Soft / non-tender     Hold (Positioning): Assistance needed to correctly position infant at breast and maintain latch. Intervention(s): Breastfeeding basics reviewed;Support Pillows;Position options  LATCH Score: 6  Lactation Tools Discussed/Used WIC Program: Yes Pump Review: Setup, frequency, and cleaning;Milk Storage Initiated by:: RN Date initiated:: 05/21/15   Consult Status Consult Status: Follow-up Date: 05/22/15 Follow-up type: In-patient    Huston FoleyMOULDEN, Paticia Moster S 05/21/2015, 2:52 PM

## 2015-05-22 MED ORDER — ACETAMINOPHEN 325 MG PO TABS: 650.0000 mg | ORAL_TABLET | ORAL | Status: DC | PRN

## 2015-05-22 MED ORDER — IBUPROFEN 600 MG PO TABS
600.0000 mg | ORAL_TABLET | Freq: Four times a day (QID) | ORAL | Status: DC
Start: 2015-05-22 — End: 2016-09-14

## 2015-05-22 NOTE — Lactation Note (Signed)
This note was copied from a baby's chart. Lactation Consultation Note Baby was sound asleep on mother's chest.  Mom reports she breast feeds then offer a supplement. She has not been pumping as recommended because she is not seeing any milk. Explained that this is normal and the importance of pumping.  Hand expression reviewed with colostrum easily expressed. Recommended mom try laid back BF at home to see if it helped baby to open his mouth wider. SHe has a double electric breast pump at home.  Encouraged her to call us if BF was not improving in the next couple of days.  Patient Name: Shannon Holland ZOXWR'UToday's Date: 05/22/2015 Reason for consult: Follow-up assessment   Maternal Data    Feeding Feeding Type: Formula Nipple Type: Slow - flow  LATCH Score/Interventions                      Lactation Tools Discussed/Used     Consult Status      Shannon Holland, Shannon Holland 05/22/2015, 10:37 AM

## 2015-05-22 NOTE — Discharge Summary (Signed)
OB Discharge Summary     Patient Name: Shannon Holland DOB: 04/17/1995 MRN: [id number]  Date of admission: 05/20/2015 Delivering MD: Aviva Signs   Date of discharge: 05/22/2015  Admitting diagnosis: 39 WKS CTX Intrauterine pregnancy: [redacted]w[redacted]d     Secondary diagnosis:  Active Problems:   Normal labor   Positive GBS test  Additional problems: none     Discharge diagnosis: Term Pregnancy Delivered                                                                                                Post partum procedures:none  Augmentation: none  Complications: None  Hospital course:  Onset of Labor With Vaginal Delivery     20 y.o. yo G1P1001 at [redacted]w[redacted]d was admitted in Active Labor on 05/20/2015. She presented complete and had a rapid SVD without augmentation. Patient had an uncomplicated labor course as follows:  Membrane Rupture Time/Date: 4:43 AM ,05/20/2015   Intrapartum Procedures: Episiotomy: None [1]                                         Lacerations:  2nd degree [3]  Patient had a delivery of a Viable infant. 05/20/2015  Information for the patient's newborn:  Audriana, Aldama [[id number]]  Delivery Method: Vag-Spont    Pateint had an uncomplicated postpartum course.  She is ambulating, tolerating a regular diet, passing flatus, and urinating well. Patient is discharged home in stable condition on 05/22/2015.    Physical exam  Filed Vitals:   05/21/15 0900 05/21/15 1338 05/21/15 1904 05/22/15 0518  BP: 107/69 95/61 99/60  93/61  Pulse: 102 89 78 66  Temp: 98.2 F (36.8 C) 98.2 F (36.8 C) 98.9 F (37.2 C) 98.2 F (36.8 C)  TempSrc: Oral Oral Oral Oral  Resp: Height:      Weight:      SpO2: 93% 100%     General: alert, cooperative, in NAD Lochia: appropriate Uterine Fundus: firm Incision: N/A DVT Evaluation: No evidence of DVT seen on physical exam. No significant calf/ankle edema. Labs: Lab Results  Component Value Date   WBC 17.0*  05/20/2015   HGB 12.3 05/20/2015   HCT 35.5* 05/20/2015   MCV 81.6 05/20/2015   PLT 218 05/20/2015   CMP Latest Ref Rng 08/24/2014  Glucose 65 - 99 mg/dL 81  BUN 6 - 20 mg/dL 11  Creatinine 0.86 - 5.78 mg/dL 4.69  Sodium 629 - 528 mmol/L 139  Potassium 3.5 - 5.1 mmol/L 3.9  Chloride 101 - 111 mmol/L 106  CO2 22 - 32 mmol/L 26  Calcium 8.9 - 10.3 mg/dL 9.7    Discharge instruction: per After Visit Summary and "Baby and Me Booklet".  After visit meds:    Medication List    STOP taking these medications        terconazole 0.8 % vaginal cream  Commonly known as:  TERAZOL 3      TAKE these medications  acetaminophen 325 MG tablet  Commonly known as:  TYLENOL  Take 2 tablets (650 mg total) by mouth every 4 (four) hours as needed (for pain scale < 4).     ibuprofen 600 MG tablet  Commonly known as:  ADVIL,MOTRIN  Take 1 tablet (600 mg total) by mouth every 6 (six) hours.     prenatal multivitamin Tabs tablet  Take 1 tablet by mouth at bedtime.        Diet: routine diet  Activity: Advance as tolerated. Pelvic rest for 6 weeks.   Outpatient follow up:6 weeks Follow up Appt:No future appointments. Follow up Visit:No Follow-up on file.  Postpartum contraception: Nexplanon  Newborn Data: Live born female  Birth Weight: 6 lb 8.8 oz (2971 g) APGAR: 9, 9  Baby Feeding: Breast Disposition:home with mother   05/22/2015 Caesar ChestnutKelly E Dashon Mcintire, MD

## 2015-05-22 NOTE — Progress Notes (Signed)
DC teaching completed with mother . All question answered.

## 2015-05-22 NOTE — Discharge Instructions (Signed)

## 2015-05-22 NOTE — Progress Notes (Signed)
POSTPARTUM PROGRESS NOTE  Post Partum Day 2 Subjective:  Shannon Holland is a 20 y.o. G1P1001 7870w0d s/p SVD.  No acute events overnight.  Pt denies problems with ambulating, voiding, or po intake- but states not feeling hungry.  She denies nausea or vomiting.  Pain is well controlled, but is having continued abdominal cramping.  She has had flatus. She has not had bowel movement.  Lochia Small- pt is unsure, but notes that the nurse said it was "okay"  Objective: Blood pressure 93/61, pulse 66, temperature 98.2 F (36.8 C), temperature source Oral, resp. rate 18, height 5\' 6"  (1.676 m), weight 66.225 kg (146 lb), last menstrual period 08/20/2014, SpO2 100 %, unknown if currently breastfeeding.  Physical Exam:  General: alert, cooperative and no distress Lochia:normal flow Chest: CTAB Heart: RRR no m/r/g Abdomen: +BS, soft, mildly tender, without guarding Uterine Fundus: firm, below umbilicus  DVT Evaluation: No calf swelling or tenderness Extremities: minimal-no edema   Recent Labs  05/20/15 0433  HGB 12.3  HCT 35.5*    Assessment/Plan:  ASSESSMENT: Shannon Holland is a 20 y.o. G1P1001 2870w0d s/p SVD PPD2  Discharge home if baby is able to go home.   LOS: 2 days   Eartha InchJessica Addison Whidbee 05/22/2015, 7:16 AM

## 2015-06-01 ENCOUNTER — Telehealth (HOSPITAL_COMMUNITY): Payer: Self-pay

## 2015-06-01 NOTE — Telephone Encounter (Signed)
Returned phone call to Aubery LappingPam Hawks,RN with [facility]. Mom reported to her that baby is not latching because his "tongue gets in the way". She reports that his tongue is visible when it is in a resting posture.  He is currently being supplemented related to weight.  Pediatrician is [facility] so suggested baby follow-up with them and their [employer]CLC Barb Carder as that is who they prefer their pt to see. Mom also has an appointment with [facility] breastfeeding support.

## 2016-09-14 ENCOUNTER — Encounter (HOSPITAL_COMMUNITY): Payer: Self-pay | Admitting: Emergency Medicine

## 2016-09-14 ENCOUNTER — Ambulatory Visit (HOSPITAL_COMMUNITY)
Admission: EM | Admit: 2016-09-14 | Discharge: 2016-09-14 | Disposition: A | Discharge status: Home / Self Care | Payer: Medicaid Other | Attending: Family Medicine | Admitting: Family Medicine

## 2016-09-14 DIAGNOSIS — J029 Acute pharyngitis, unspecified: Secondary | ICD-10-CM | POA: Insufficient documentation

## 2016-09-14 DIAGNOSIS — Z880 Allergy status to penicillin: Secondary | ICD-10-CM | POA: Insufficient documentation

## 2016-09-14 DIAGNOSIS — J3489 Other specified disorders of nose and nasal sinuses: Secondary | ICD-10-CM

## 2016-09-14 LAB — POCT RAPID STREP A: STREPTOCOCCUS, GROUP A SCREEN (DIRECT): NEGATIVE

## 2016-09-14 MED ORDER — CHLORHEXIDINE GLUCONATE 0.12 % MT SOLN: 15.0000 mL | Freq: Two times a day (BID) | OROMUCOSAL | 0 refills | Status: DC

## 2016-09-14 MED ORDER — CEFDINIR 300 MG PO CAPS: 600.0000 mg | ORAL_CAPSULE | Freq: Every day | ORAL | 0 refills | Status: DC

## 2016-09-14 NOTE — Discharge Instructions (Signed)
Gargle twice a day with the chlorhexidine and gluconate

## 2016-09-14 NOTE — ED Triage Notes (Signed)
Pt here for sore throat x 3 days  

## 2016-09-14 NOTE — ED Provider Notes (Signed)
[facility]    CSN: [id number] Arrival date & time: 09/14/16  1349     History   Chief Complaint Chief Complaint  Patient presents with  . Sore Throat    HPI Shannon Holland is a 21 y.o. female.   Pt here for sore throat x 3 days.  she works at the [employer]. She has an associated cough and her appetite is been diminished because of the pain when swallowing.  Patient also has sinus pressure.      Past Medical History:  Diagnosis Date  . Anemia     Patient Active Problem List   Diagnosis Date Noted  . Normal labor 05/20/2015  . Positive GBS test 05/20/2015    Past Surgical History:  Procedure Laterality Date  . MOUTH SURGERY      OB History    Gravida Para Term Preterm AB Living   1 1 1     1    SAB TAB Ectopic Multiple Live Births         0 1       Home Medications    Prior to Admission medications   Medication Sig Start Date End Date Taking? Authorizing Provider  cefdinir (OMNICEF) 300 MG capsule Take 2 capsules (600 mg total) by mouth daily. 09/14/16   Elvina SidleLauenstein, Donavin Audino, MD  chlorhexidine (PERIDEX) 0.12 % solution Use as directed 15 mLs in the mouth or throat 2 (two) times daily. 09/14/16   Elvina SidleLauenstein, Raaga Maeder, MD  Prenatal Vit-Fe Fumarate-FA (PRENATAL MULTIVITAMIN) TABS tablet Take 1 tablet by mouth at bedtime.     [provider]    Family History History reviewed. No pertinent family history.  Social History Social History  Substance Use Topics  . Smoking status: Never Smoker  . Smokeless tobacco: Not on file  . Alcohol use No     Allergies   Shrimp [shellfish allergy] and Penicillins   Review of Systems Review of Systems  Constitutional: Positive for appetite change. Negative for fever.  HENT: Positive for congestion, sinus pain and sore throat.   Respiratory: Positive for cough.   All other systems reviewed and are negative.    Physical Exam Triage Vital Signs ED Triage Vitals  Enc Vitals Group   BP 09/14/16 1451 114/73     Pulse Rate 09/14/16 1451 71     Resp 09/14/16 1451 18     Temp 09/14/16 1451 98.5 F (36.9 C)     Temp Source 09/14/16 1451 Oral     SpO2 09/14/16 1451 100 %     Weight 09/14/16 1451 108 lb (49 kg)     Height 09/14/16 1451 5\' 6"  (1.676 m)     Head Circumference --      Peak Flow --      Pain Score 09/14/16 1452 7     Pain Loc --      Pain Edu? --      Excl. in GC? --    No data found.   Updated Vital Signs BP 114/73 (BP Location: Right Arm)   Pulse 71   Temp 98.5 F (36.9 C) (Oral)   Resp 18   Ht 5\' 6"  (1.676 m)   Wt 108 lb (49 kg)   SpO2 100%   BMI 17.43 kg/m    Physical Exam  Constitutional: She is oriented to person, place, and time. She appears well-developed and well-nourished.  HENT:  Right Ear: External ear normal.  Left Ear: External ear normal.  Mouth/Throat: Oropharynx  is clear and moist.  Eyes: Pupils are equal, round, and reactive to light. Conjunctivae are normal.  Neck: Normal range of motion. Neck supple.  Cardiovascular: Normal rate, regular rhythm and normal heart sounds.   Pulmonary/Chest: Effort normal and breath sounds normal.  Musculoskeletal: Normal range of motion.  Neurological: She is alert and oriented to person, place, and time.  Skin: Skin is warm and dry.  Nursing note and vitals reviewed.    UC Treatments / Results  Labs (all labs ordered are listed, but only abnormal results are displayed) Labs Reviewed - No data to display  EKG  EKG Interpretation None       Radiology No results found.  Procedures Procedures (including critical care time)  Medications Ordered in UC Medications - No data to display   Initial Impression / Assessment and Plan / UC Course  I have reviewed the triage vital signs and the nursing notes.  Pertinent labs & imaging results that were available during my care of the patient were reviewed by me and considered in my medical decision making (see chart for  details).     Final Clinical Impressions(s) / UC Diagnoses   Final diagnoses:  Acute pharyngitis, unspecified etiology  Sinus pain    New Prescriptions New Prescriptions   CEFDINIR (OMNICEF) 300 MG CAPSULE    Take 2 capsules (600 mg total) by mouth daily.   CHLORHEXIDINE (PERIDEX) 0.12 % SOLUTION    Use as directed 15 mLs in the mouth or throat 2 (two) times daily.     Controlled Substance Prescriptions [place] Controlled Substance Registry consulted? Not Applicable   Elvina Sidle, MD 09/14/16 [phone]

## 2016-09-17 LAB — CULTURE, GROUP A STREP (THRC)

## 2017-03-31 ENCOUNTER — Other Ambulatory Visit: Payer: Self-pay

## 2017-03-31 ENCOUNTER — Ambulatory Visit (HOSPITAL_COMMUNITY)
Admission: EM | Admit: 2017-03-31 | Discharge: 2017-03-31 | Disposition: A | Discharge status: Home / Self Care | Payer: Self-pay | Attending: Family Medicine | Admitting: Family Medicine

## 2017-03-31 ENCOUNTER — Encounter (HOSPITAL_COMMUNITY): Payer: Self-pay | Admitting: Emergency Medicine

## 2017-03-31 DIAGNOSIS — R6884 Jaw pain: Secondary | ICD-10-CM | POA: Insufficient documentation

## 2017-03-31 DIAGNOSIS — Z3202 Encounter for pregnancy test, result negative: Secondary | ICD-10-CM

## 2017-03-31 DIAGNOSIS — M542 Cervicalgia: Secondary | ICD-10-CM

## 2017-03-31 DIAGNOSIS — R350 Frequency of micturition: Secondary | ICD-10-CM | POA: Insufficient documentation

## 2017-03-31 DIAGNOSIS — M791 Myalgia, unspecified site: Secondary | ICD-10-CM | POA: Insufficient documentation

## 2017-03-31 DIAGNOSIS — R109 Unspecified abdominal pain: Secondary | ICD-10-CM

## 2017-03-31 DIAGNOSIS — R399 Unspecified symptoms and signs involving the genitourinary system: Secondary | ICD-10-CM

## 2017-03-31 DIAGNOSIS — N898 Other specified noninflammatory disorders of vagina: Secondary | ICD-10-CM | POA: Insufficient documentation

## 2017-03-31 DIAGNOSIS — Z793 Long term (current) use of hormonal contraceptives: Secondary | ICD-10-CM | POA: Insufficient documentation

## 2017-03-31 LAB — POCT URINALYSIS DIP (DEVICE)
Bilirubin Urine: NEGATIVE
GLUCOSE, UA: NEGATIVE mg/dL
HGB URINE DIPSTICK: NEGATIVE
KETONES UR: NEGATIVE mg/dL
LEUKOCYTES UA: NEGATIVE
Nitrite: NEGATIVE
PROTEIN: NEGATIVE mg/dL
Specific Gravity, Urine: >=1.03 (ref 1.005–1.030)
Urobilinogen, UA: 0.2 mg/dL (ref 0.0–1.0)
pH: 6.5 (ref 5.0–8.0)

## 2017-03-31 LAB — POCT PREGNANCY, URINE: Preg Test, Ur: NEGATIVE

## 2017-03-31 MED ORDER — METRONIDAZOLE 500 MG PO TABS: 500.0000 mg | ORAL_TABLET | Freq: Two times a day (BID) | ORAL | 0 refills | Status: AC

## 2017-03-31 MED ORDER — NITROFURANTOIN MONOHYD MACRO 100 MG PO CAPS: 100.0000 mg | ORAL_CAPSULE | Freq: Two times a day (BID) | ORAL | 0 refills | Status: DC
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
## 2017-04-01 LAB — URINE CULTURE: Culture: NO GROWTH

## 2017-04-01 LAB — CERVICOVAGINAL ANCILLARY ONLY
Bacterial vaginitis: NEGATIVE
CANDIDA VAGINITIS: NEGATIVE
CHLAMYDIA, DNA PROBE: NEGATIVE
NEISSERIA GONORRHEA: NEGATIVE
Trichomonas: NEGATIVE

## 2017-06-21 ENCOUNTER — Other Ambulatory Visit: Payer: Self-pay

## 2017-06-21 ENCOUNTER — Ambulatory Visit (HOSPITAL_COMMUNITY)
Admission: EM | Admit: 2017-06-21 | Discharge: 2017-06-21 | Disposition: A | Discharge status: Home / Self Care | Payer: Medicaid Other | Attending: Family Medicine | Admitting: Family Medicine

## 2017-06-21 ENCOUNTER — Encounter (HOSPITAL_COMMUNITY): Payer: Self-pay | Admitting: Emergency Medicine

## 2017-06-21 DIAGNOSIS — Z880 Allergy status to penicillin: Secondary | ICD-10-CM | POA: Diagnosis not present

## 2017-06-21 DIAGNOSIS — Z91013 Allergy to seafood: Secondary | ICD-10-CM | POA: Insufficient documentation

## 2017-06-21 DIAGNOSIS — O99511 Diseases of the respiratory system complicating pregnancy, first trimester: Secondary | ICD-10-CM | POA: Insufficient documentation

## 2017-06-21 DIAGNOSIS — Z3A01 Less than 8 weeks gestation of pregnancy: Secondary | ICD-10-CM

## 2017-06-21 DIAGNOSIS — Z9889 Other specified postprocedural states: Secondary | ICD-10-CM | POA: Insufficient documentation

## 2017-06-21 DIAGNOSIS — J069 Acute upper respiratory infection, unspecified: Secondary | ICD-10-CM

## 2017-06-21 DIAGNOSIS — Z8249 Family history of ischemic heart disease and other diseases of the circulatory system: Secondary | ICD-10-CM | POA: Diagnosis not present

## 2017-06-21 DIAGNOSIS — B9789 Other viral agents as the cause of diseases classified elsewhere: Secondary | ICD-10-CM

## 2017-06-21 LAB — POCT RAPID STREP A: Streptococcus, Group A Screen (Direct): NEGATIVE

## 2017-06-21 NOTE — ED Triage Notes (Addendum)
Sick for 3-4 days.  Complains of a dry throat, cough, head pain is slight.  Reports clear sinus congestion.  Left ear is hurting.  feels swelling in throat  Patient is [redacted] weeks pregnant.

## 2017-06-21 NOTE — ED Provider Notes (Signed)
[facility]    CSN: [id number] Arrival date & time: 06/21/17  1420     History   Chief Complaint Chief Complaint  Patient presents with  . URI    HPI Shannon Holland is a 22 y.o. female.   HPI Patient states that she has had a cold for 5 days.  Runny and stuffy nose.  Mild sore throat.  Headache.  Dry cough.  Wants to know what she can take for her cold.  She states she is about [redacted] weeks pregnant. No fever or chills.  No purulence.  No sputum production.  No nausea or vomiting. She states her prior pregnancy is unremarkable.  This is her second baby. Past Medical History:  Diagnosis Date  . Anemia     Patient Active Problem List   Diagnosis Date Noted  . Normal labor 05/20/2015  . Positive GBS test 05/20/2015    Past Surgical History:  Procedure Laterality Date  . MOUTH SURGERY      OB History    Gravida  2   Para  1   Term  1   Preterm      AB      Living  1     SAB      TAB      Ectopic      Multiple  0   Live Births  1            Home Medications    Prior to Admission medications   Medication Sig Start Date End Date Taking? Authorizing Provider  Prenatal Vit-Fe Fumarate-FA (PRENATAL MULTIVITAMIN) TABS tablet Take 1 tablet by mouth at bedtime.     [provider]    Family History Family History  Problem Relation Age of Onset  . Hypertension Mother     Social History Social History   Tobacco Use  . Smoking status: Never Smoker  . Smokeless tobacco: Never Used  Substance Use Topics  . Alcohol use: No  . Drug use: No     Allergies   Shrimp [shellfish allergy] and Penicillins   Review of Systems Review of Systems  Constitutional: Negative for chills and fever.  HENT: Positive for congestion, postnasal drip, rhinorrhea, sneezing and sore throat. Negative for ear pain and trouble swallowing.   Eyes: Negative for pain and visual disturbance.  Respiratory: Positive for cough. Negative for  shortness of breath and wheezing.   Cardiovascular: Negative for chest pain and palpitations.  Gastrointestinal: Negative for abdominal pain, nausea and vomiting.  Genitourinary: Negative for dysuria, hematuria and vaginal bleeding.  Musculoskeletal: Negative for arthralgias and back pain.  Skin: Negative for color change and rash.  Neurological: Negative for seizures and syncope.  All other systems reviewed and are negative.    Physical Exam Triage Vital Signs ED Triage Vitals  Enc Vitals Group     BP 06/21/17 1554 107/72     Pulse Rate 06/21/17 1554 76     Resp 06/21/17 1554 16     Temp 06/21/17 1554 98.3 F (36.8 C)     Temp Source 06/21/17 1554 Oral     SpO2 06/21/17 1554 100 %     Weight --      Height --      Head Circumference --      Peak Flow --      Pain Score 06/21/17 1612 6     Pain Loc --      Pain Edu? --  Excl. in GC? --    No data found.  Updated Vital Signs BP 107/72 (BP Location: Left Arm)   Pulse 76   Temp 98.3 F (36.8 C) (Oral)   Resp 16   LMP 05/05/2017   SpO2 100%   Visual Acuity Right Eye Distance:   Left Eye Distance:   Bilateral Distance:    Right Eye Near:   Left Eye Near:    Bilateral Near:     Physical Exam  Constitutional: She appears well-developed and well-nourished. No distress.  HENT:  Head: Normocephalic and atraumatic.  Right Ear: External ear normal.  Left Ear: External ear normal.  Nose: Nose normal.  Mouth/Throat: Oropharynx is clear and moist.  Clear rhinorrhea.  Posterior pharynx mildly injected.  No exudate  Eyes: Pupils are equal, round, and reactive to light. Conjunctivae are normal.  Neck: Normal range of motion. Neck supple.  Cardiovascular: Normal rate, regular rhythm and normal heart sounds.  No murmur heard. Pulmonary/Chest: Effort normal and breath sounds normal. No respiratory distress.  Abdominal: Soft. She exhibits no distension. There is no tenderness.  Musculoskeletal: She exhibits no edema.    Lymphadenopathy:    She has cervical adenopathy.  Neurological: She is alert.  Skin: Skin is warm and dry.  Psychiatric: She has a normal mood and affect.  Nursing note and vitals reviewed.    UC Treatments / Results  Labs (all labs ordered are listed, but only abnormal results are displayed) Labs Reviewed  CULTURE, GROUP A STREP [facility])  POCT RAPID STREP A    EKG None  Radiology No results found.  Procedures Procedures (including critical care time)  Medications Ordered in UC Medications - No data to display  Initial Impression / Assessment and Plan / UC Course  I have reviewed the triage vital signs and the nursing notes.  Pertinent labs & imaging results that were available during my care of the patient were reviewed by me and considered in my medical decision making (see chart for details).     I discussed with the patient that is really better not to take any medications during early pregnancy.  She may take Tylenol for pain or fever.  She can take her prenatal vitamin.  Other than that she can use salt water gargles, honey for coughing, rest and fluids. Final Clinical Impressions(s) / UC Diagnoses   Final diagnoses:  Viral URI with cough  Pregnancy with 6 completed weeks gestation     Discharge Instructions     Drink plenty of water Salt water gargles Tylenol for pain and fever May use saline nasal spray Honey for cough Strep negative   ED Prescriptions    None     Controlled Substance Prescriptions [place] Controlled Substance Registry consulted? Not Applicable   Eustace Moore, MD 06/21/17 2255

## 2017-06-21 NOTE — Discharge Instructions (Addendum)
Drink plenty of water Salt water gargles Tylenol for pain and fever May use saline nasal spray Honey for cough Strep negative

## 2017-06-24 LAB — CULTURE, GROUP A STREP (THRC)

## 2017-07-04 ENCOUNTER — Inpatient Hospital Stay (HOSPITAL_COMMUNITY)
Admission: AD | Admit: 2017-07-04 | Discharge: 2017-07-04 | Disposition: A | Discharge status: Home / Self Care | Payer: Medicaid Other | Source: Ambulatory Visit | Attending: Obstetrics and Gynecology | Admitting: Obstetrics and Gynecology

## 2017-07-04 ENCOUNTER — Other Ambulatory Visit: Payer: Self-pay

## 2017-07-04 ENCOUNTER — Encounter (HOSPITAL_COMMUNITY): Payer: Self-pay | Admitting: *Deleted

## 2017-07-04 DIAGNOSIS — O99511 Diseases of the respiratory system complicating pregnancy, first trimester: Secondary | ICD-10-CM | POA: Diagnosis not present

## 2017-07-04 DIAGNOSIS — O9989 Other specified diseases and conditions complicating pregnancy, childbirth and the puerperium: Secondary | ICD-10-CM | POA: Diagnosis not present

## 2017-07-04 DIAGNOSIS — Z3A08 8 weeks gestation of pregnancy: Secondary | ICD-10-CM | POA: Insufficient documentation

## 2017-07-04 DIAGNOSIS — Z3491 Encounter for supervision of normal pregnancy, unspecified, first trimester: Secondary | ICD-10-CM

## 2017-07-04 DIAGNOSIS — J029 Acute pharyngitis, unspecified: Secondary | ICD-10-CM | POA: Diagnosis not present

## 2017-07-04 DIAGNOSIS — O26891 Other specified pregnancy related conditions, first trimester: Secondary | ICD-10-CM | POA: Insufficient documentation

## 2017-07-04 DIAGNOSIS — R1030 Lower abdominal pain, unspecified: Secondary | ICD-10-CM | POA: Diagnosis present

## 2017-07-04 LAB — URINALYSIS, ROUTINE W REFLEX MICROSCOPIC
BILIRUBIN URINE: NEGATIVE
Glucose, UA: NEGATIVE mg/dL
Hgb urine dipstick: NEGATIVE
KETONES UR: 5 mg/dL — AB
Nitrite: NEGATIVE
PH: 7 (ref 5.0–8.0)
Protein, ur: NEGATIVE mg/dL
Specific Gravity, Urine: 1.019 (ref 1.005–1.030)

## 2017-07-04 LAB — WET PREP, GENITAL
Clue Cells Wet Prep HPF POC: NONE SEEN
SPERM: NONE SEEN
Trich, Wet Prep: NONE SEEN
YEAST WET PREP: NONE SEEN

## 2017-07-04 LAB — POCT PREGNANCY, URINE: Preg Test, Ur: POSITIVE — AB

## 2017-07-04 LAB — GROUP A STREP BY PCR: Group A Strep by PCR: NOT DETECTED

## 2017-07-04 NOTE — Discharge Instructions (Signed)
Safe Medications in Pregnancy  ° °Acne: °Benzoyl Peroxide °Salicylic Acid ° °Backache/Headache: °Tylenol: 2 regular strength every 4 hours OR °             2 Extra strength every 6 hours ° °Colds/Coughs/Allergies: °Benadryl (alcohol free) 25 mg every 6 hours as needed °Breath right strips °Claritin °Cepacol throat lozenges °Chloraseptic throat spray °Cold-Eeze- up to three times per day °Cough drops, alcohol free °Flonase (by prescription only) °Guaifenesin °Mucinex °Robitussin DM (plain only, alcohol free) °Saline nasal spray/drops °Sudafed (pseudoephedrine) & Actifed ** use only after [redacted] weeks gestation and if you do not have high blood pressure °Tylenol °Vicks Vaporub °Zinc lozenges °Zyrtec  ° °Constipation: °Colace °Ducolax suppositories °Fleet enema °Glycerin suppositories °Metamucil °Milk of magnesia °Miralax °Senokot °Smooth move tea ° °Diarrhea: °Kaopectate °Imodium A-D ° °*NO pepto Bismol ° °Hemorrhoids: °Anusol °Anusol HC °Preparation H °Tucks ° °Indigestion: °Tums °Maalox °Mylanta °Zantac  °Pepcid ° °Insomnia: °Benadryl (alcohol free) 25mg every 6 hours as needed °Tylenol PM °Unisom, no Gelcaps ° °Leg Cramps: °Tums °MagGel ° °Nausea/Vomiting:  °Bonine °Dramamine °Emetrol °Ginger extract °Sea bands °Meclizine  °Nausea medication to take during pregnancy:  °Unisom (doxylamine succinate 25 mg tablets) Take one tablet daily at bedtime. If symptoms are not adequately controlled, the dose can be increased to a maximum recommended dose of two tablets daily (1/2 tablet in the morning, 1/2 tablet mid-afternoon and one at bedtime). °Vitamin B6 100mg tablets. Take one tablet twice a day (up to 200 mg per day). ° °Skin Rashes: °Aveeno products °Benadryl cream or 25mg every 6 hours as needed °Calamine Lotion °1% cortisone cream ° °Yeast infection: °Gyne-lotrimin 7 °Monistat 7 ° °Gum/tooth pain: °Anbesol ° °**If taking multiple medications, please check labels to avoid duplicating the same active ingredients °**take  medication as directed on the label °** Do not exceed 4000 mg of tylenol in 24 hours °**Do not take medications that contain aspirin or ibuprofen ° ° ° ° ° °Pharyngitis °Pharyngitis is redness, pain, and swelling (inflammation) of the throat (pharynx). It is a very common cause of sore throat. Pharyngitis can be caused by a bacteria, but it is usually caused by a virus. Most cases of pharyngitis get better on their own without treatment. °What are the causes? °This condition may be caused by: °· Infection by viruses (viral). Viral pharyngitis spreads from person to person (is contagious) through coughing, sneezing, and sharing of personal items or utensils such as cups, forks, spoons, and toothbrushes. °· Infection by bacteria (bacterial). Bacterial pharyngitis may be spread by touching the nose or face after coming in contact with the bacteria, or through more intimate contact, such as kissing. °· Allergies. Allergies can cause buildup of mucus in the throat (post-nasal drip), leading to inflammation and irritation. Allergies can also cause blocked nasal passages, forcing breathing through the mouth, which dries and irritates the throat. ° °What increases the risk? °You are more likely to develop this condition if: °· You are 5-24 years old. °· You are exposed to crowded environments such as daycare, school, or dormitory living. °· You live in a cold climate. °· You have a weakened disease-fighting (immune) system. ° °What are the signs or symptoms? °Symptoms of this condition vary by the cause (viral, bacterial, or allergies) and can include: °· Sore throat. °· Fatigue. °· Low-grade fever. °· Headache. °· Joint pain and muscle aches. °· Skin rashes. °· Swollen glands in the throat (lymph nodes). °· Plaque-like film on the throat or tonsils. This is often   a symptom of bacterial pharyngitis. °· Vomiting. °· Stuffy nose (nasal congestion). °· Cough. °· Red, itchy eyes (conjunctivitis). °· Loss of appetite. ° °How is  this diagnosed? °This condition is often diagnosed based on your medical history and a physical exam. Your health care provider will ask you questions about your illness and your symptoms. A swab of your throat may be done to check for bacteria (rapid strep test). Other lab tests may also be done, depending on the suspected cause, but these are rare. °How is this treated? °This condition usually gets better in 3-4 days without medicine. Bacterial pharyngitis may be treated with antibiotic medicines. °Follow these instructions at home: °· Take over-the-counter and prescription medicines only as told by your health care provider. °? If you were prescribed an antibiotic medicine, take it as told by your health care provider. Do not stop taking the antibiotic even if you start to feel better. °? Do not give children aspirin because of the association with Reye syndrome. °· Drink enough water and fluids to keep your urine clear or pale yellow. °· Get a lot of rest. °· Gargle with a salt-water mixture 3-4 times a day or as needed. To make a salt-water mixture, completely dissolve ½-1 tsp of salt in 1 cup of warm water. °· If your health care provider approves, you may use throat lozenges or sprays to soothe your throat. °Contact a health care provider if: °· You have large, tender lumps in your neck. °· You have a rash. °· You cough up green, yellow-brown, or bloody spit. °Get help right away if: °· Your neck becomes stiff. °· You drool or are unable to swallow liquids. °· You cannot drink or take medicines without vomiting. °· You have severe pain that does not go away, even after you take medicine. °· You have trouble breathing, and it is not caused by a stuffy nose. °· You have new pain and swelling in your joints such as the knees, ankles, wrists, or elbows. °Summary °· Pharyngitis is redness, pain, and swelling (inflammation) of the throat (pharynx). °· While pharyngitis can be caused by a bacteria, the most common  causes are viral. °· Most cases of pharyngitis get better on their own without treatment. °· Bacterial pharyngitis is treated with antibiotic medicines. °This information is not intended to replace advice given to you by your health care provider. Make sure you discuss any questions you have with your health care provider. °Document Released: 01/20/2005 Document Revised: 02/26/2016 Document Reviewed: 02/26/2016 °Elsevier Interactive Patient Education © 2018 [employer] ° °

## 2017-07-04 NOTE — MAU Provider Note (Signed)
History     CSN: [id number]  Arrival date and time: 07/04/17 1557   First Provider Initiated Contact with Patient 07/04/17 1627      Chief Complaint  Patient presents with  . Abdominal Pain  . Sore Throat   HPI Shannon Holland is a 22 y.o. G2P1001 at [redacted]w[redacted]d by sure LMP who presents with sore throat and abdominal cramping. Sore throat x 2+ weeks. Went to urgent care a few weeks ago, had a negative strep swab. Was told that she couldn't take any medication for her URI or sore throat symptoms due to pregnancy. States the throat pain has gradually worsened. Rates pain 10/10. Has tried salt water gargles without relief. Coughing makes pain worse. Occasional clear sputum. Denies ear pain or fever/chills. No sick contacts. No history of strep throat. Does not perform oral sex. Does not smoke.  Lower abdominal cramping for the last 4 days. Rates pain 6/10. Nothing makes better or worse. Denies vaginal bleeding or dysuria. Some thin clear discharge. No odor or irritation.  Plans on going to [facility] for prenatal care. Waiting for her pregnancy medicaid.   OB History    Gravida  2   Para  1   Term  1   Preterm      AB      Living  1     SAB      TAB      Ectopic      Multiple  0   Live Births  1           Past Medical History:  Diagnosis Date  . Anemia     Past Surgical History:  Procedure Laterality Date  . MOUTH SURGERY      Family History  Problem Relation Age of Onset  . Hypertension Mother     Social History   Tobacco Use  . Smoking status: Never Smoker  . Smokeless tobacco: Never Used  Substance Use Topics  . Alcohol use: No  . Drug use: No    Allergies:  Allergies  Allergen Reactions  . Shrimp [Shellfish Allergy] Anaphylaxis  . Penicillins Rash    Has patient had a PCN reaction causing immediate rash, facial/tongue/throat swelling, SOB or lightheadedness with hypotension: Yes Has patient had a PCN reaction causing severe rash involving mucus  membranes or skin necrosis: No Has patient had a PCN reaction that required hospitalization No Has patient had a PCN reaction occurring within the last 10 years: Yes If all of the above answers are "NO", then may proceed with Cephalosporin use.     Medications Prior to Admission  Medication Sig Dispense Refill Last Dose  . Prenatal Vit-Fe Fumarate-FA (PRENATAL MULTIVITAMIN) TABS tablet Take 1 tablet by mouth at bedtime.    05/19/2015 at Unknown time    Review of Systems  Constitutional: Negative for chills and fever.  HENT: Positive for sore throat. Negative for drooling, ear pain, mouth sores, rhinorrhea, sinus pain, sneezing, trouble swallowing and voice change.   Respiratory: Positive for cough. Negative for shortness of breath and wheezing.   Gastrointestinal: Positive for abdominal pain and nausea. Negative for constipation, diarrhea and vomiting.  Genitourinary: Positive for vaginal discharge. Negative for dysuria and vaginal bleeding.   Physical Exam   Blood pressure 110/67, pulse 87, temperature 98.6 F (37 C), temperature source Oral, resp. rate 15, last menstrual period 05/05/2017, SpO2 100 %, unknown if currently breastfeeding.  Physical Exam  Nursing note and vitals reviewed. Constitutional: She is oriented to  person, place, and time. She appears well-developed and well-nourished. No distress.  HENT:  Head: Normocephalic and atraumatic.  Mouth/Throat: Oropharynx is clear and moist. No oropharyngeal exudate, posterior oropharyngeal erythema or tonsillar abscesses.  Eyes: Conjunctivae are normal. Right eye exhibits no discharge. Left eye exhibits no discharge. No scleral icterus.  Neck: Normal range of motion.  Cardiovascular: Normal rate, regular rhythm and normal heart sounds.  No murmur heard. Respiratory: Effort normal and breath sounds normal. No respiratory distress. She has no wheezes.  GI: Soft. There is no tenderness.  Genitourinary: Cervix exhibits no motion  tenderness. No bleeding in the vagina.  Genitourinary Comments: Cervix closed  Lymphadenopathy:       Head (right side): No submental, no submandibular and no tonsillar adenopathy present.       Head (left side): No submental, no submandibular and no tonsillar adenopathy present.  Neurological: She is alert and oriented to person, place, and time.  Skin: Skin is warm and dry. She is not diaphoretic.  Psychiatric: She has a normal mood and affect. Her behavior is normal. Judgment and thought content normal.    MAU Course  Procedures Results for orders placed or performed during the hospital encounter of 07/04/17 (from the past 24 hour(s))  Urinalysis, Routine w reflex microscopic     Status: Abnormal   Collection Time: 07/04/17  4:01 PM  Result Value Ref Range   Color, Urine YELLOW YELLOW   APPearance CLOUDY (A) CLEAR   Specific Gravity, Urine 1.019 1.005 - 1.030   pH 7.0 5.0 - 8.0   Glucose, UA NEGATIVE NEGATIVE mg/dL   Hgb urine dipstick NEGATIVE NEGATIVE   Bilirubin Urine NEGATIVE NEGATIVE   Ketones, ur 5 (A) NEGATIVE mg/dL   Protein, ur NEGATIVE NEGATIVE mg/dL   Nitrite NEGATIVE NEGATIVE   Leukocytes, UA TRACE (A) NEGATIVE   RBC / HPF 0-5 0 - 5 RBC/hpf   WBC, UA 0-5 0 - 5 WBC/hpf   Bacteria, UA FEW (A) NONE SEEN   Squamous Epithelial / LPF 0-5 0 - 5   Mucus PRESENT    Amorphous Crystal PRESENT   Pregnancy, urine POC     Status: Abnormal   Collection Time: 07/04/17  4:27 PM  Result Value Ref Range   Preg Test, Ur POSITIVE (A) NEGATIVE  Wet prep, genital     Status: Abnormal   Collection Time: 07/04/17  4:53 PM  Result Value Ref Range   Yeast Wet Prep HPF POC NONE SEEN NONE SEEN   Trich, Wet Prep NONE SEEN NONE SEEN   Clue Cells Wet Prep HPF POC NONE SEEN NONE SEEN   WBC, Wet Prep HPF POC MODERATE (A) NONE SEEN   Sperm NONE SEEN     MDM Pt informed that the ultrasound is considered a limited OB ultrasound and is not intended to be a complete ultrasound exam.  Patient  also informed that the ultrasound is not being completed with the intent of assessing for fetal or placental anomalies or any pelvic abnormalities.  Explained that the purpose of today's ultrasound is to assess for  viability.  Patient acknowledges the purpose of the exam and the limitations of the study.   IUP measuring 2123w6d by CRL (c/w LMP). FHR 160.   VSS, NAD. Strep swab collected Discussed with patient treatment of sore throat and other symptoms. Symptoms likely due to allergies and postnasal drainage, but if strep + will tx with abx.  Will give OTC meds list  GC/CT & wet prep collected  Assessment and Plan  A:  1. Pharyngitis, unspecified etiology   2. Normal IUP (intrauterine pregnancy) on prenatal ultrasound, first trimester   3. [redacted] weeks gestation of pregnancy     P: Discharge home OTC meds list given  Discussed reasons to return to MAU Start prenatal care GC/CT, urine culture, & strep throat swab pending   Judeth Horn 07/04/2017, 4:27 PM

## 2017-07-04 NOTE — MAU Note (Signed)
Pt reports abd cramping for 5-6 days, sore throat for 10 days. Denies fever. [facility]

## 2017-07-05 LAB — CULTURE, OB URINE

## 2017-07-06 LAB — GC/CHLAMYDIA PROBE AMP (~~LOC~~) NOT AT ARMC
Chlamydia: NEGATIVE
Neisseria Gonorrhea: NEGATIVE

## 2017-07-06 LAB — POCT PREGNANCY, URINE: Preg Test, Ur: POSITIVE — AB

## 2017-07-23 ENCOUNTER — Other Ambulatory Visit (HOSPITAL_COMMUNITY)
Admission: RE | Admit: 2017-07-23 | Discharge: 2017-07-23 | Disposition: A | Discharge status: Home / Self Care | Payer: Medicaid Other | Source: Ambulatory Visit | Attending: Obstetrics & Gynecology | Admitting: Obstetrics & Gynecology

## 2017-07-23 ENCOUNTER — Encounter: Payer: Self-pay | Admitting: Certified Nurse Midwife

## 2017-07-23 ENCOUNTER — Ambulatory Visit (INDEPENDENT_AMBULATORY_CARE_PROVIDER_SITE_OTHER): Payer: Medicaid Other | Admitting: Certified Nurse Midwife

## 2017-07-23 VITALS — BP 100/67 | HR 88 | Wt 116.7 lb

## 2017-07-23 DIAGNOSIS — Z3490 Encounter for supervision of normal pregnancy, unspecified, unspecified trimester: Secondary | ICD-10-CM | POA: Diagnosis present

## 2017-07-23 DIAGNOSIS — Z3481 Encounter for supervision of other normal pregnancy, first trimester: Secondary | ICD-10-CM | POA: Diagnosis not present

## 2017-07-23 DIAGNOSIS — Z3493 Encounter for supervision of normal pregnancy, unspecified, third trimester: Secondary | ICD-10-CM | POA: Insufficient documentation

## 2017-07-23 DIAGNOSIS — J029 Acute pharyngitis, unspecified: Secondary | ICD-10-CM

## 2017-07-23 DIAGNOSIS — Z3A00 Weeks of gestation of pregnancy not specified: Secondary | ICD-10-CM | POA: Insufficient documentation

## 2017-07-23 DIAGNOSIS — Z3491 Encounter for supervision of normal pregnancy, unspecified, first trimester: Secondary | ICD-10-CM

## 2017-07-23 NOTE — Progress Notes (Signed)
Subjective:   Shannon Holland is a 22 y.o. G2P1001 at [redacted]w[redacted]d by LMP being seen today for her first obstetrical visit.  Her obstetrical history is significant for nothing. Patient does intend to breast feed. Pregnancy history fully reviewed.  Patient reports throat soreness.  HISTORY: OB History  Gravida Para Term Preterm AB Living  2 1 1  0 0 1  SAB TAB Ectopic Multiple Live Births  0 0 0 0 1    # Outcome Date GA Lbr Len/2nd Weight Sex Delivery Anes PTL Lv  2 Current           1 Term 05/20/15 [redacted]w[redacted]d 03:06 / 00:56 6 lb 8.8 oz (2.971 kg) M Vag-Spont None  LIV     Birth Comments: none     Name: Piechowski,BOY Nimrit     Apgar1: 9  Apgar5: 9    Last pap smear was done 2017 and was normal  Past Medical History:  Diagnosis Date  . Anemia    Past Surgical History:  Procedure Laterality Date  . MOUTH SURGERY     Family History  Problem Relation Age of Onset  . Hypertension Mother    Social History   Tobacco Use  . Smoking status: Never Smoker  . Smokeless tobacco: Never Used  Substance Use Topics  . Alcohol use: No  . Drug use: No   Allergies  Allergen Reactions  . Shrimp [Shellfish Allergy] Anaphylaxis  . Penicillins Rash    Has patient had a PCN reaction causing immediate rash, facial/tongue/throat swelling, SOB or lightheadedness with hypotension: Yes Has patient had a PCN reaction causing severe rash involving mucus membranes or skin necrosis: No Has patient had a PCN reaction that required hospitalization No Has patient had a PCN reaction occurring within the last 10 years: Yes If all of the above answers are "NO", then may proceed with Cephalosporin use.    Current Outpatient Medications on File Prior to Visit  Medication Sig Dispense Refill  . Prenatal Vit-Fe Fumarate-FA (PRENATAL MULTIVITAMIN) TABS tablet Take 1 tablet by mouth at bedtime.      No current facility-administered medications on file prior to visit.     Review of Systems Pertinent  items noted in HPI and remainder of comprehensive ROS otherwise negative.  Exam   Vitals:   07/23/17 1448  BP: 100/67  Pulse: 88  Weight: 116 lb 11.2 oz (52.9 kg)    Pelvic Exam: Perineum: no hemorrhoids, normal perineum   Vulva: normal external genitalia, no lesions   Vagina:  normal mucosa, normal discharge   Cervix: no lesions and normal, pap smear done.    Adnexa: normal adnexa and no mass, fullness, tenderness   Bony Pelvis: average  System: General: well-developed, well-nourished female in no acute distress   Breast:  normal appearance, no masses or tenderness   Skin: normal coloration and turgor, no rashes   Neurologic: oriented, normal, negative, normal mood   Extremities: normal strength, tone, and muscle mass, ROM of all joints is normal   HEENT PERRLA, extraocular movement intact and sclera clear.   Mouth/Teeth mucous membranes moist, pharynx normal without lesions and dental hygiene good   Neck supple and no masses   Cardiovascular: regular rate and rhythm   Respiratory:  no respiratory distress, normal breath sounds   Abdomen: soft, non-tender; bowel sounds normal; no masses,  no organomegaly     Assessment:   Pregnancy: G2P1001 Patient Active Problem List   Diagnosis Date Noted  . Encounter  for supervision of normal pregnancy, unspecified, unspecified trimester 07/23/2017  . Normal labor 05/20/2015  . Positive GBS test 05/20/2015     Plan:  1. Encounter for supervision of normal pregnancy, antepartum, unspecified gravidity -Patient doing well, has nausea and vomiting on occasion but does not want medication at this time using natural remedies for management. - Cytology - PAP - Culture, OB Urine - Cystic Fibrosis Mutation 97 - Hemoglobinopathy evaluation - Obstetric Panel, Including HIV - Genetic Screening  2. Throat soreness -She reports having allergies for the past 3 weeks and took allergy medication as MAU suggested but feels like her throat is dry,  on examination no patches or erythema noted. -Discussed safe medications to take in pregnancy including throat lozenges for sore throat    Initial labs drawn. Continue prenatal vitamins. Genetic Screening discussed, NIPS: ordered. Ultrasound discussed; fetal anatomic survey: requested. Problem list reviewed and updated. The nature of [facility] with multiple MDs and other Advanced Practice Providers was explained to patient; also emphasized that residents, students are part of our team. Routine obstetric precautions reviewed. Return in about 1 month (around 08/20/2017) for ROB.    Sharyon CableVeronica C Stefano Trulson, CNM 07/23/17, 3:55 PM

## 2017-07-23 NOTE — Patient Instructions (Addendum)

## 2017-07-23 NOTE — Progress Notes (Signed)
Pt complains of having a sore throat. She has no other concerns

## 2017-07-24 LAB — CYTOLOGY - PAP: Diagnosis: NEGATIVE

## 2017-07-26 LAB — URINE CULTURE, OB REFLEX

## 2017-07-26 LAB — CULTURE, OB URINE

## 2017-07-27 LAB — OBSTETRIC PANEL, INCLUDING HIV
Antibody Screen: NEGATIVE
Basophils Absolute: 0 10*3/uL (ref 0.0–0.2)
Basos: 0 %
EOS (ABSOLUTE): 0.1 10*3/uL (ref 0.0–0.4)
Eos: 1 %
HIV Screen 4th Generation wRfx: NONREACTIVE
Hematocrit: 34.7 % (ref 34.0–46.6)
Hemoglobin: 11.5 g/dL (ref 11.1–15.9)
Hepatitis B Surface Ag: NEGATIVE
Immature Grans (Abs): 0 10*3/uL (ref 0.0–0.1)
Immature Granulocytes: 0 %
Lymphocytes Absolute: 2 10*3/uL (ref 0.7–3.1)
Lymphs: 19 %
MCH: 27.8 pg (ref 26.6–33.0)
MCHC: 33.1 g/dL (ref 31.5–35.7)
MCV: 84 fL (ref 79–97)
Monocytes Absolute: 0.6 10*3/uL (ref 0.1–0.9)
Monocytes: 6 %
Neutrophils Absolute: 8.2 10*3/uL — ABNORMAL HIGH (ref 1.4–7.0)
Neutrophils: 74 %
Platelets: 343 10*3/uL (ref 150–450)
RBC: 4.13 x10E6/uL (ref 3.77–5.28)
RDW: 13.8 % (ref 12.3–15.4)
RPR Ser Ql: NONREACTIVE
Rh Factor: POSITIVE
Rubella Antibodies, IGG: 1.41 index (ref >0.99)
WBC: 10.9 10*3/uL — ABNORMAL HIGH (ref 3.4–10.8)

## 2017-07-27 LAB — HEMOGLOBINOPATHY EVALUATION
HGB C: 0 %
HGB S: 0 %
HGB VARIANT: 0 %
Hemoglobin A2 Quantitation: 2.3 % (ref 1.8–3.2)
Hemoglobin F Quantitation: 0 % (ref 0.0–2.0)
Hgb A: 97.7 % (ref 96.4–98.8)

## 2017-07-30 ENCOUNTER — Encounter: Payer: Self-pay | Admitting: *Deleted

## 2017-07-30 LAB — CYSTIC FIBROSIS MUTATION 97: Interpretation: NOT DETECTED

## 2017-08-20 ENCOUNTER — Encounter: Payer: Self-pay | Admitting: Obstetrics and Gynecology

## 2017-08-20 ENCOUNTER — Ambulatory Visit (INDEPENDENT_AMBULATORY_CARE_PROVIDER_SITE_OTHER): Payer: Medicaid Other | Admitting: Obstetrics and Gynecology

## 2017-08-20 VITALS — BP 100/67 | HR 75 | Wt 118.2 lb

## 2017-08-20 DIAGNOSIS — R252 Cramp and spasm: Secondary | ICD-10-CM

## 2017-08-20 DIAGNOSIS — Z3490 Encounter for supervision of normal pregnancy, unspecified, unspecified trimester: Secondary | ICD-10-CM

## 2017-08-20 NOTE — Progress Notes (Signed)
error 

## 2017-08-20 NOTE — Progress Notes (Signed)
   PRENATAL VISIT NOTE  Subjective:  Shannon Holland is a 22 y.o. G2P1001 at 642w2d being seen today for ongoing prenatal care.  She is currently monitored for the following issues for this low-risk pregnancy and has Normal labor and Encounter for supervision of normal pregnancy, unspecified, unspecified trimester on their problem list.  Patient reports occasional stomach cramping and leg cramping.  Contractions: Irritability. Vag. Bleeding: None.   . Denies leaking of fluid.   The following portions of the patient's history were reviewed and updated as appropriate: allergies, current medications, past family history, past medical history, past social history, past surgical history and problem list. Problem list updated.  Objective:   Vitals:   08/20/17 1058  BP: 100/67  Pulse: 75  Weight: 118 lb 3.2 oz (53.6 kg)    Fetal Status: Fetal Heart Rate (bpm): 147         General:  Alert, oriented and cooperative. Patient is in no acute distress.  Skin: Skin is warm and dry. No rash noted.   Cardiovascular: Normal heart rate noted  Respiratory: Normal respiratory effort, no problems with respiration noted  Abdomen: Soft, gravid, appropriate for gestational age.  Pain/Pressure: Absent     Pelvic: Cervical exam deferred        Extremities: Normal range of motion.  Edema: None  Mental Status: Normal mood and affect. Normal behavior. Normal judgment and thought content.   Assessment and Plan:  Pregnancy: G2P1001 at 442w2d  1. Encounter for supervision of normal pregnancy, antepartum, unspecified gravidity Anatomy ordered today Genetic screening normal  2. Leg cramping Encouraged improvement in diet and hydration   Preterm labor symptoms and general obstetric precautions including but not limited to vaginal bleeding, contractions, leaking of fluid and fetal movement were reviewed in detail with the patient. Please refer to After Visit Summary for other counseling recommendations.    Return in about 1 month (around 09/17/2017) for OB visit.  Future Appointments  Date Time Provider Department Center  08/28/2017  3:30 PM [facility] US 1 [facility]    Conan BowensKelly M Dvon Jiles, MD

## 2017-08-28 ENCOUNTER — Ambulatory Visit (HOSPITAL_COMMUNITY): Payer: Medicaid Other

## 2017-08-31 ENCOUNTER — Encounter (HOSPITAL_COMMUNITY): Payer: Self-pay

## 2017-09-07 ENCOUNTER — Other Ambulatory Visit (HOSPITAL_COMMUNITY): Payer: Self-pay | Admitting: *Deleted

## 2017-09-07 ENCOUNTER — Other Ambulatory Visit: Payer: Self-pay | Admitting: Obstetrics and Gynecology

## 2017-09-07 ENCOUNTER — Ambulatory Visit (HOSPITAL_COMMUNITY)
Admission: RE | Admit: 2017-09-07 | Discharge: 2017-09-07 | Disposition: A | Discharge status: Home / Self Care | Payer: Medicaid Other | Source: Ambulatory Visit | Attending: Obstetrics and Gynecology | Admitting: Obstetrics and Gynecology

## 2017-09-07 DIAGNOSIS — Z3492 Encounter for supervision of normal pregnancy, unspecified, second trimester: Secondary | ICD-10-CM | POA: Insufficient documentation

## 2017-09-07 DIAGNOSIS — O281 Abnormal biochemical finding on antenatal screening of mother: Secondary | ICD-10-CM | POA: Diagnosis not present

## 2017-09-07 DIAGNOSIS — Z3A17 17 weeks gestation of pregnancy: Secondary | ICD-10-CM

## 2017-09-07 DIAGNOSIS — Z3490 Encounter for supervision of normal pregnancy, unspecified, unspecified trimester: Secondary | ICD-10-CM | POA: Diagnosis present

## 2017-09-07 DIAGNOSIS — Z362 Encounter for other antenatal screening follow-up: Secondary | ICD-10-CM

## 2017-09-07 DIAGNOSIS — Z363 Encounter for antenatal screening for malformations: Secondary | ICD-10-CM

## 2017-09-17 ENCOUNTER — Encounter: Payer: Self-pay | Admitting: Obstetrics and Gynecology

## 2017-09-17 ENCOUNTER — Ambulatory Visit (INDEPENDENT_AMBULATORY_CARE_PROVIDER_SITE_OTHER): Payer: Medicaid Other | Admitting: Obstetrics and Gynecology

## 2017-09-17 VITALS — BP 99/62 | HR 60 | Wt 124.3 lb

## 2017-09-17 DIAGNOSIS — Z3480 Encounter for supervision of other normal pregnancy, unspecified trimester: Secondary | ICD-10-CM

## 2017-09-17 DIAGNOSIS — Z3482 Encounter for supervision of other normal pregnancy, second trimester: Secondary | ICD-10-CM

## 2017-09-17 NOTE — Patient Instructions (Signed)

## 2017-09-17 NOTE — Progress Notes (Signed)
Subjective:  Shannon Holland is a 22 y.o. G2P1001 at 6112w2d being seen today for ongoing prenatal care.  She is currently monitored for the following issues for this low-risk pregnancy and has Encounter for supervision of normal pregnancy, unspecified, unspecified trimester on their problem list.  Patient reports no complaints.  Contractions: Irritability. Vag. Bleeding: None.   . Denies leaking of fluid.   The following portions of the patient's history were reviewed and updated as appropriate: allergies, current medications, past family history, past medical history, past social history, past surgical history and problem list. Problem list updated.  Objective:   Vitals:   09/17/17 1316  BP: 99/62  Pulse: 60  Weight: 124 lb 4.8 oz (56.4 kg)    Fetal Status:           General:  Alert, oriented and cooperative. Patient is in no acute distress.  Skin: Skin is warm and dry. No rash noted.   Cardiovascular: Normal heart rate noted  Respiratory: Normal respiratory effort, no problems with respiration noted  Abdomen: Soft, gravid, appropriate for gestational age. Pain/Pressure: Absent     Pelvic:  Cervical exam deferred        Extremities: Normal range of motion.  Edema: None  Mental Status: Normal mood and affect. Normal behavior. Normal judgment and thought content.   Urinalysis:      Assessment and Plan:  Pregnancy: G2P1001 at 6312w2d  1. Supervision of other normal pregnancy, antepartum Stable - AFP, Serum, Open Spina Bifida  Preterm labor symptoms and general obstetric precautions including but not limited to vaginal bleeding, contractions, leaking of fluid and fetal movement were reviewed in detail with the patient. Please refer to After Visit Summary for other counseling recommendations.  Return in about 4 weeks (around 10/15/2017) for OB visit.   Hermina StaggersErvin, Kushal Saunders L, MD

## 2017-10-15 ENCOUNTER — Encounter: Payer: Self-pay | Admitting: Obstetrics and Gynecology

## 2017-10-15 ENCOUNTER — Ambulatory Visit (INDEPENDENT_AMBULATORY_CARE_PROVIDER_SITE_OTHER): Payer: Medicaid Other | Admitting: Obstetrics and Gynecology

## 2017-10-15 VITALS — BP 105/69 | HR 87 | Wt 136.5 lb

## 2017-10-15 DIAGNOSIS — Z3480 Encounter for supervision of other normal pregnancy, unspecified trimester: Secondary | ICD-10-CM

## 2017-10-15 NOTE — Progress Notes (Signed)
Patient reports good fetal movement and occasional uterine irritability.

## 2017-10-15 NOTE — Progress Notes (Signed)
   PRENATAL VISIT NOTE  Subjective:  Shannon Holland is a 22 y.o. G2P1001 at 3645w2d being seen today for ongoing prenatal care.  She is currently monitored for the following issues for this low-risk pregnancy and has Encounter for supervision of normal pregnancy, unspecified, unspecified trimester on their problem list.  Patient reports no complaints.  Contractions: Irritability. Vag. Bleeding: None.  Movement: Present. Denies leaking of fluid.   The following portions of the patient's history were reviewed and updated as appropriate: allergies, current medications, past family history, past medical history, past social history, past surgical history and problem list. Problem list updated.  Objective:   Vitals:   10/15/17 1330  BP: 105/69  Pulse: 87  Weight: 136 lb 8 oz (61.9 kg)    Fetal Status: Fetal Heart Rate (bpm): 150 Fundal Height: 23 cm Movement: Present     General:  Alert, oriented and cooperative. Patient is in no acute distress.  Skin: Skin is warm and dry. No rash noted.   Cardiovascular: Normal heart rate noted  Respiratory: Normal respiratory effort, no problems with respiration noted  Abdomen: Soft, gravid, appropriate for gestational age.  Pain/Pressure: Absent     Pelvic: Cervical exam deferred        Extremities: Normal range of motion.  Edema: None  Mental Status: Normal mood and affect. Normal behavior. Normal judgment and thought content.   Assessment and Plan:  Pregnancy: G2P1001 at 5745w2d  1. Supervision of other normal pregnancy, antepartum Patient is doing well without complaints Third trimester labs next visit with glucola and tdap Follow up anatomy ultrasound scheduled  Preterm labor symptoms and general obstetric precautions including but not limited to vaginal bleeding, contractions, leaking of fluid and fetal movement were reviewed in detail with the patient. Please refer to After Visit Summary for other counseling recommendations.  Return in  about 4 weeks (around 11/12/2017) for ROB, 2 hr glucola next visit.  Future Appointments  Date Time Provider Department Center  10/15/2017  2:00 PM Mick Tanguma, Gigi GinPeggy, MD [facility] None  10/19/2017  2:15 PM [facility] US 4 [facility]    Catalina AntiguaPeggy Dannisha Eckmann, MD

## 2017-10-19 ENCOUNTER — Ambulatory Visit (HOSPITAL_COMMUNITY): Payer: Medicaid Other

## 2017-10-27 ENCOUNTER — Ambulatory Visit (HOSPITAL_COMMUNITY)
Admission: RE | Admit: 2017-10-27 | Discharge: 2017-10-27 | Disposition: A | Discharge status: Home / Self Care | Payer: Medicaid Other | Source: Ambulatory Visit | Attending: Obstetrics and Gynecology | Admitting: Obstetrics and Gynecology

## 2017-10-27 DIAGNOSIS — Z3A25 25 weeks gestation of pregnancy: Secondary | ICD-10-CM | POA: Diagnosis not present

## 2017-10-27 DIAGNOSIS — O281 Abnormal biochemical finding on antenatal screening of mother: Secondary | ICD-10-CM

## 2017-10-27 DIAGNOSIS — Z362 Encounter for other antenatal screening follow-up: Secondary | ICD-10-CM | POA: Diagnosis not present

## 2017-11-12 ENCOUNTER — Encounter: Payer: Self-pay | Admitting: Obstetrics and Gynecology

## 2017-11-12 ENCOUNTER — Ambulatory Visit (INDEPENDENT_AMBULATORY_CARE_PROVIDER_SITE_OTHER): Payer: Medicaid Other | Admitting: Obstetrics and Gynecology

## 2017-11-12 ENCOUNTER — Encounter: Payer: Self-pay | Admitting: Obstetrics

## 2017-11-12 VITALS — BP 114/74 | HR 93 | Temp 98.5°F | Wt 143.4 lb

## 2017-11-12 DIAGNOSIS — Z23 Encounter for immunization: Secondary | ICD-10-CM

## 2017-11-12 DIAGNOSIS — Z3480 Encounter for supervision of other normal pregnancy, unspecified trimester: Secondary | ICD-10-CM

## 2017-11-12 DIAGNOSIS — Z3482 Encounter for supervision of other normal pregnancy, second trimester: Secondary | ICD-10-CM

## 2017-11-12 NOTE — Progress Notes (Signed)
Pt presents for 2 gtt labs. Pt wishes to defer vaccines until next visit. Pt has cold sx; has not tried OTC meds yet. Temp 98.5

## 2017-11-12 NOTE — Patient Instructions (Signed)

## 2017-11-12 NOTE — Progress Notes (Signed)
   PRENATAL VISIT NOTE  Subjective:  Shannon Holland is a 22 y.o. G2P1001 at [redacted]w[redacted]d being seen today for ongoing prenatal care.  She is currently monitored for the following issues for this low-risk pregnancy and has Encounter for supervision of normal pregnancy, unspecified, unspecified trimester on their problem list.  Patient reports occasional contractions.  Contractions: Irregular. Vag. Bleeding: None.  Movement: Present. Denies leaking of fluid.   The following portions of the patient's history were reviewed and updated as appropriate: allergies, current medications, past family history, past medical history, past social history, past surgical history and problem list. Problem list updated.  Objective:   Vitals:   11/12/17 0926  BP: 114/74  Pulse: 93  Temp: 98.5 F (36.9 C)  Weight: 143 lb 6.4 oz (65 kg)    Fetal Status:     Movement: Present     General:  Alert, oriented and cooperative. Patient is in no acute distress.  Skin: Skin is warm and dry. No rash noted.   Cardiovascular: Normal heart rate noted  Respiratory: Normal respiratory effort, no problems with respiration noted  Abdomen: Soft, gravid, appropriate for gestational age.  Pain/Pressure: Absent     Pelvic: Cervical exam deferred        Extremities: Normal range of motion.  Edema: None  Mental Status: Normal mood and affect. Normal behavior. Normal judgment and thought content.   Assessment and Plan:  Pregnancy: G2P1001 at [redacted]w[redacted]d  1. Supervision of other normal pregnancy, antepartum Tdap today Flu shot next visit - Glucose Tolerance, 2 Hours w/1 Hour - CBC - HIV Antibody (routine testing w rflx) - RPR  2. Nose bleeds Has occasionally, monthly nose bleeds To call with any issues  Preterm labor symptoms and general obstetric precautions including but not limited to vaginal bleeding, contractions, leaking of fluid and fetal movement were reviewed in detail with the patient. Please refer to After  Visit Summary for other counseling recommendations.  Return in about 2 weeks (around 11/26/2017) for OB visit.  No future appointments.  Conan Bowens, MD

## 2017-11-13 LAB — GLUCOSE TOLERANCE, 2 HOURS W/ 1HR
Glucose, 1 hour: 150 mg/dL (ref 65–179)
Glucose, 2 hour: 88 mg/dL (ref 65–152)
Glucose, Fasting: 75 mg/dL (ref 65–91)

## 2017-11-13 LAB — HIV ANTIBODY (ROUTINE TESTING W REFLEX): HIV Screen 4th Generation wRfx: NONREACTIVE

## 2017-11-13 LAB — CBC
HEMATOCRIT: 35.3 % (ref 34.0–46.6)
Hemoglobin: 11.6 g/dL (ref 11.1–15.9)
MCH: 28 pg (ref 26.6–33.0)
MCHC: 32.9 g/dL (ref 31.5–35.7)
MCV: 85 fL (ref 79–97)
Platelets: 206 10*3/uL (ref 150–450)
RBC: 4.15 x10E6/uL (ref 3.77–5.28)
RDW: 12.7 % (ref 12.3–15.4)
WBC: 17.4 10*3/uL — AB (ref 3.4–10.8)

## 2017-11-13 LAB — RPR: RPR: NONREACTIVE

## 2017-11-26 ENCOUNTER — Ambulatory Visit (INDEPENDENT_AMBULATORY_CARE_PROVIDER_SITE_OTHER): Payer: Medicaid Other | Admitting: Obstetrics & Gynecology

## 2017-11-26 VITALS — BP 107/68 | HR 90 | Wt 146.6 lb

## 2017-11-26 DIAGNOSIS — M549 Dorsalgia, unspecified: Secondary | ICD-10-CM

## 2017-11-26 DIAGNOSIS — O9989 Other specified diseases and conditions complicating pregnancy, childbirth and the puerperium: Secondary | ICD-10-CM

## 2017-11-26 DIAGNOSIS — Z3483 Encounter for supervision of other normal pregnancy, third trimester: Secondary | ICD-10-CM

## 2017-11-26 MED ORDER — ABDOMINAL BINDER/ELASTIC MED MISC: 2 refills | Status: DC

## 2017-11-26 MED ORDER — ACETAMINOPHEN 500 MG PO TABS
500.0000 mg | ORAL_TABLET | Freq: Four times a day (QID) | ORAL | 0 refills | Status: DC | PRN
Start: 2017-11-26 — End: 2018-02-07

## 2017-11-26 MED ORDER — CYCLOBENZAPRINE HCL 10 MG PO TABS: 10.0000 mg | ORAL_TABLET | Freq: Three times a day (TID) | ORAL | 1 refills | Status: DC | PRN

## 2017-11-26 NOTE — Progress Notes (Signed)
   PRENATAL VISIT NOTE  Subjective:  Shannon Holland is a 22 y.o. G2P1001 at [redacted]w[redacted]d being seen today for ongoing prenatal care.  She is currently monitored for the following issues for this low-risk pregnancy and has Supervision of normal pregnancy in third trimester on their problem list.  Patient reports backache and lower back pain. Contractions: Irregular and rare.  Vag. Bleeding: None.  Movement: Present. Denies leaking of fluid.   The following portions of the patient's history were reviewed and updated as appropriate: allergies, current medications, past family history, past medical history, past social history, past surgical history and problem list. Problem list updated.  Objective:   Vitals:   11/26/17 1119  BP: 107/68  Pulse: 90  Weight: 146 lb 9.6 oz (66.5 kg)    Fetal Status: Fetal Heart Rate (bpm): 150 Fundal Height: 29 cm Movement: Present     General:  Alert, oriented and cooperative. Patient is in no acute distress.  Skin: Skin is warm and dry. No rash noted.   Cardiovascular: Normal heart rate noted  Respiratory: Normal respiratory effort, no problems with respiration noted  Abdomen: Soft, gravid, appropriate for gestational age.  Pain/Pressure: Present     Pelvic: Cervical exam deferred        Extremities: Normal range of motion.  Edema: None  Mental Status: Normal mood and affect. Normal behavior. Normal judgment and thought content.   Assessment and Plan:  Pregnancy: G2P1001 at [redacted]w[redacted]d  1. Back pain affecting pregnancy in third trimester Analgesia given, recommended binder too. Also recommended visit to chiropractor (they were considering this). - cyclobenzaprine (FLEXERIL) 10 MG tablet; Take 1 tablet (10 mg total) by mouth every 8 (eight) hours as needed for muscle spasms.  Dispense: 30 tablet; Refill: 1 - acetaminophen (TYLENOL) 500 MG tablet; Take 1 tablet (500 mg total) by mouth every 6 (six) hours as needed.  Dispense: 30 tablet; Refill: 0 - Elastic  Bandages & Supports (ABDOMINAL BINDER/ELASTIC MED) MISC; Please provide one binder  Dispense: 1 each; Refill: 2  2. Encounter for supervision of other normal pregnancy in third trimester Preterm labor symptoms and general obstetric precautions including but not limited to vaginal bleeding, contractions, leaking of fluid and fetal movement were reviewed in detail with the patient. Please refer to After Visit Summary for other counseling recommendations.  Return in about 2 weeks (around 12/10/2017) for OB Visit.  Future Appointments  Date Time Provider Department Center  12/10/2017 11:15 AM Constant, Gigi Gin, MD [facility] None    Jaynie Collins, MD

## 2017-11-26 NOTE — Patient Instructions (Signed)
Return to clinic for any scheduled appointments or obstetric concerns, or go to [facility] for evaluation  Research childbirth classes and hospital preregistration at [email or web address]  Fetal Movement Counts Patient Name: ________________________________________________ Patient Due Date: ____________________ What is a fetal movement count? A fetal movement count is the number of times that you feel your baby move during a certain amount of time. This may also be called a fetal kick count. A fetal movement count is recommended for every pregnant woman. You may be asked to start counting fetal movements as early as week 28 of your pregnancy. Pay attention to when your baby is most active. You may notice your baby's sleep and wake cycles. You may also notice things that make your baby move more. You should do a fetal movement count:  When your baby is normally most active.  At the same time each day.  A good time to count movements is while you are resting, after having something to eat and drink. How do I count fetal movements? 1. Find a quiet, comfortable area. Sit, or lie down on your side. 2. Write down the date, the start time and stop time, and the number of movements that you felt between those two times. Take this information with you to your health care visits. 3. For 2 hours, count kicks, flutters, swishes, rolls, and jabs. You should feel at least 10 movements during 2 hours. 4. You may stop counting after you have felt 10 movements. 5. If you do not feel 10 movements in 2 hours, have something to eat and drink. Then, keep resting and counting for 1 hour. If you feel at least 4 movements during that hour, you may stop counting. Contact a health care provider if:  You feel fewer than 4 movements in 2 hours.  Your baby is not moving like he or she usually does. Date: ____________ Start time: ____________ Stop time: ____________ Movements: ____________ Date: ____________ Start time:  ____________ Stop time: ____________ Movements: ____________ Date: ____________ Start time: ____________ Stop time: ____________ Movements: ____________ Date: ____________ Start time: ____________ Stop time: ____________ Movements: ____________ Date: ____________ Start time: ____________ Stop time: ____________ Movements: ____________ Date: ____________ Start time: ____________ Stop time: ____________ Movements: ____________ Date: ____________ Start time: ____________ Stop time: ____________ Movements: ____________ Date: ____________ Start time: ____________ Stop time: ____________ Movements: ____________ Date: ____________ Start time: ____________ Stop time: ____________ Movements: ____________ This information is not intended to replace advice given to you by your health care provider. Make sure you discuss any questions you have with your health care provider. Document Released: 02/19/2006 Document Revised: 09/19/2015 Document Reviewed: 03/01/2015 Elsevier Interactive Patient Education  2018 [employer]  Braxton Hicks Contractions Contractions of the uterus can occur throughout pregnancy, but they are not always a sign that you are in labor. You may have practice contractions called Braxton Hicks contractions. These false labor contractions are sometimes confused with true labor. What are Braxton Hicks contractions? Braxton Hicks contractions are tightening movements that occur in the muscles of the uterus before labor. Unlike true labor contractions, these contractions do not result in opening (dilation) and thinning of the cervix. Toward the end of pregnancy (32-34 weeks), Braxton Hicks contractions can happen more often and may become stronger. These contractions are sometimes difficult to tell apart from true labor because they can be very uncomfortable. You should not feel embarrassed if you go to the hospital with false labor. Sometimes, the only way to tell if you are in true labor is    for your health care provider to look for changes in the cervix. The health care provider will do a physical exam and may monitor your contractions. If you are not in true labor, the exam should show that your cervix is not dilating and your water has not broken. If there are other health problems associated with your pregnancy, it is completely safe for you to be sent home with false labor. You may continue to have Braxton Hicks contractions until you go into true labor. How to tell the difference between true labor and false labor True labor  Contractions last 30-70 seconds.  Contractions become very regular.  Discomfort is usually felt in the top of the uterus, and it spreads to the lower abdomen and low back.  Contractions do not go away with walking.  Contractions usually become more intense and increase in frequency.  The cervix dilates and gets thinner. False labor  Contractions are usually shorter and not as strong as true labor contractions.  Contractions are usually irregular.  Contractions are often felt in the front of the lower abdomen and in the groin.  Contractions may go away when you walk around or change positions while lying down.  Contractions get weaker and are shorter-lasting as time goes on.  The cervix usually does not dilate or become thin. Follow these instructions at home:  Take over-the-counter and prescription medicines only as told by your health care provider.  Keep up with your usual exercises and follow other instructions from your health care provider.  Eat and drink lightly if you think you are going into labor.  If Braxton Hicks contractions are making you uncomfortable: ? Change your position from lying down or resting to walking, or change from walking to resting. ? Sit and rest in a tub of warm water. ? Drink enough fluid to keep your urine pale yellow. Dehydration may cause these contractions. ? Do slow and deep breathing several times  an hour.  Keep all follow-up prenatal visits as told by your health care provider. This is important. Contact a health care provider if:  You have a fever.  You have continuous pain in your abdomen. Get help right away if:  Your contractions become stronger, more regular, and closer together.  You have fluid leaking or gushing from your vagina.  You pass blood-tinged mucus (bloody show).  You have bleeding from your vagina.  You have low back pain that you never had before.  You feel your baby's head pushing down and causing pelvic pressure.  Your baby is not moving inside you as much as it used to. Summary  Contractions that occur before labor are called Braxton Hicks contractions, false labor, or practice contractions.  Braxton Hicks contractions are usually shorter, weaker, farther apart, and less regular than true labor contractions. True labor contractions usually become progressively stronger and regular and they become more frequent.  Manage discomfort from Braxton Hicks contractions by changing position, resting in a warm bath, drinking plenty of water, or practicing deep breathing. This information is not intended to replace advice given to you by your health care provider. Make sure you discuss any questions you have with your health care provider. Document Released: 06/05/2016 Document Revised: 06/05/2016 Document Reviewed: 06/05/2016 Elsevier Interactive Patient Education  2018 [employer]    

## 2017-12-10 ENCOUNTER — Ambulatory Visit (INDEPENDENT_AMBULATORY_CARE_PROVIDER_SITE_OTHER): Payer: Medicaid Other | Admitting: Obstetrics and Gynecology

## 2017-12-10 ENCOUNTER — Encounter: Payer: Self-pay | Admitting: Obstetrics and Gynecology

## 2017-12-10 ENCOUNTER — Other Ambulatory Visit: Payer: Self-pay

## 2017-12-10 VITALS — BP 100/62 | HR 91 | Wt 152.0 lb

## 2017-12-10 DIAGNOSIS — Z3483 Encounter for supervision of other normal pregnancy, third trimester: Secondary | ICD-10-CM

## 2017-12-10 DIAGNOSIS — Z23 Encounter for immunization: Secondary | ICD-10-CM

## 2017-12-10 NOTE — Progress Notes (Signed)
   PRENATAL VISIT NOTE  Subjective:  Shannon Holland is a 22 y.o. G2P1001 at [redacted]w[redacted]d being seen today for ongoing prenatal care.  She is currently monitored for the following issues for this low-risk pregnancy and has Supervision of normal pregnancy in third trimester on their problem list.  Patient reports backpain.  Contractions: Irregular. Vag. Bleeding: None.  Movement: Present. Denies leaking of fluid.   The following portions of the patient's history were reviewed and updated as appropriate: allergies, current medications, past family history, past medical history, past social history, past surgical history and problem list. Problem list updated.  Objective:   Vitals:   12/10/17 1114  BP: 100/62  Pulse: 91  Weight: 152 lb (68.9 kg)    Fetal Status: Fetal Heart Rate (bpm): 134(Simultaneous filing. User may not have seen previous data.) Fundal Height: 31 cm Movement: Present     General:  Alert, oriented and cooperative. Patient is in no acute distress.  Skin: Skin is warm and dry. No rash noted.   Cardiovascular: Normal heart rate noted  Respiratory: Normal respiratory effort, no problems with respiration noted  Abdomen: Soft, gravid, appropriate for gestational age.  Pain/Pressure: Present     Pelvic: Cervical exam deferred        Extremities: Normal range of motion.  Edema: None  Mental Status: Normal mood and affect. Normal behavior. Normal judgment and thought content.   Assessment and Plan:  Pregnancy: G2P1001 at [redacted]w[redacted]d  1. Encounter for supervision of other normal pregnancy in third trimester Patient is doing well reporting persistent backpain. She does not like the flexeril and does not like the maternity support belt.  She is requesting a work note limiting her acitivity Will refer to PT Encouraged patient to stay active and to stretch  Preterm labor symptoms and general obstetric precautions including but not limited to vaginal bleeding, contractions, leaking of  fluid and fetal movement were reviewed in detail with the patient. Please refer to After Visit Summary for other counseling recommendations.  Return in about 2 weeks (around 12/24/2017) for ROB.  No future appointments.  Catalina Antigua, MD

## 2017-12-28 ENCOUNTER — Ambulatory Visit (INDEPENDENT_AMBULATORY_CARE_PROVIDER_SITE_OTHER): Payer: Medicaid Other | Admitting: Obstetrics and Gynecology

## 2017-12-28 ENCOUNTER — Encounter: Payer: Self-pay | Admitting: Obstetrics and Gynecology

## 2017-12-28 ENCOUNTER — Encounter: Payer: Medicaid Other | Admitting: Obstetrics and Gynecology

## 2017-12-28 VITALS — BP 102/68 | HR 99 | Wt 152.0 lb

## 2017-12-28 DIAGNOSIS — Z3A33 33 weeks gestation of pregnancy: Secondary | ICD-10-CM

## 2017-12-28 DIAGNOSIS — Z3483 Encounter for supervision of other normal pregnancy, third trimester: Secondary | ICD-10-CM

## 2017-12-28 NOTE — Patient Instructions (Signed)
Back Pain in Pregnancy Back pain during pregnancy is common. Back pain may be caused by several factors that are related to changes during your pregnancy. Follow these instructions at home: Managing pain, stiffness, and swelling  If directed, apply ice for sudden (acute) back pain. ? Put ice in a plastic bag. ? Place a towel between your skin and the bag. ? Leave the ice on for 20 minutes, 2-3 times per day.  If directed, apply heat to the affected area before you exercise: ? Place a towel between your skin and the heat pack or heating pad. ? Leave the heat on for 20-30 minutes. ? Remove the heat if your skin turns bright red. This is especially important if you are unable to feel pain, heat, or cold. You may have a greater risk of getting burned. Activity  Exercise as told by your health care provider. Exercising is the best way to prevent or manage back pain.  Listen to your body when lifting. If lifting hurts, ask for help or bend your knees. This uses your leg muscles instead of your back muscles.  Squat down when picking up something from the floor. Do not bend over.  Only use bed rest as told by your health care provider. Bed rest should only be used for the most severe episodes of back pain. Standing, Sitting, and Lying Down  Do not stand in one place for long periods of time.  Use good posture when sitting. Make sure your head rests over your shoulders and is not hanging forward. Use a pillow on your lower back if necessary.  Try sleeping on your side, preferably the left side, with a pillow or two between your legs. If you are sore after a night's rest, your bed may be too soft. A firm mattress may provide more support for your back during pregnancy. General instructions  Do not wear high heels.  Eat a healthy diet. Try to gain weight within your health care provider's recommendations.  Use a maternity girdle, elastic sling, or back brace as told by your health care  provider.  Take over-the-counter and prescription medicines only as told by your health care provider.  Keep all follow-up visits as told by your health care provider. This is important. This includes any visits with any specialists, such as a physical therapist. Contact a health care provider if:  Your back pain interferes with your daily activities.  You have increasing pain in other parts of your body. Get help right away if:  You develop numbness, tingling, weakness, or problems with the use of your arms or legs.  You develop severe back pain that is not controlled with medicine.  You have a sudden change in bowel or bladder control.  You develop shortness of breath, dizziness, or you faint.  You develop nausea, vomiting, or sweating.  You have back pain that is a rhythmic, cramping pain similar to labor pains. Labor pain is usually 1-2 minutes apart, lasts for about 1 minute, and involves a bearing down feeling or pressure in your pelvis.  You have back pain and your water breaks or you have vaginal bleeding.  You have back pain or numbness that travels down your leg.  Your back pain developed after you fell.  You develop pain on one side of your back.  You see blood in your urine.  You develop skin blisters in the area of your back pain. This information is not intended to replace advice given to you   by your health care provider. Make sure you discuss any questions you have with your health care provider. Document Released: 04/30/2005 Document Revised: 06/28/2015 Document Reviewed: 10/04/2014 Elsevier Interactive Patient Education  2018 [employer]  

## 2017-12-28 NOTE — Progress Notes (Signed)
Pt wants to discuss being taken out of work due to back pain.  Pt states she was not able to get out of bed to go to physical therapy and kept rescheduling appt.  No relief f w/ medications.

## 2017-12-28 NOTE — Progress Notes (Signed)
   PRENATAL VISIT NOTE  Subjective:  Shannon CallanderGabrielle A Holland is a 22 y.o. G2P1001 at 1735w6d being seen today for ongoing prenatal care.  She is currently monitored for the following issues for this low-risk pregnancy and has Supervision of normal pregnancy in third trimester on their problem list.  Patient reports persistent back pain and is requesting medical leave.  Contractions: Irritability. Vag. Bleeding: None.  Movement: Present. Denies leaking of fluid.   The following portions of the patient's history were reviewed and updated as appropriate: allergies, current medications, past family history, past medical history, past social history, past surgical history and problem list. Problem list updated.  Objective:   Vitals:   12/28/17 1330  BP: 102/68  Pulse: 99  Weight: 152 lb (68.9 kg)    Fetal Status: Fetal Heart Rate (bpm): 146 Fundal Height: 33 cm Movement: Present     General:  Alert, oriented and cooperative. Patient is in no acute distress.  Skin: Skin is warm and dry. No rash noted.   Cardiovascular: Normal heart rate noted  Respiratory: Normal respiratory effort, no problems with respiration noted  Abdomen: Soft, gravid, appropriate for gestational age.  Pain/Pressure: Present     Pelvic: Cervical exam deferred        Extremities: Normal range of motion.  Edema: None  Mental Status: Normal mood and affect. Normal behavior. Normal judgment and thought content.   Assessment and Plan:  Pregnancy: G2P1001 at 4835w6d  1. Encounter for supervision of other normal pregnancy in third trimester Patient reports persistent back pain. She had to reschedule her physical therapy evaluation Patient is requesting medical leave- request denied until physical therapy evaluation and recommendation. Informed patient that backache is common in majority of pregnancy and stressed the importance of back exercises  Preterm labor symptoms and general obstetric precautions including but not limited  to vaginal bleeding, contractions, leaking of fluid and fetal movement were reviewed in detail with the patient. Please refer to After Visit Summary for other counseling recommendations.  Return in about 2 weeks (around 01/11/2018) for ROB, Low risk.  No future appointments.  Catalina AntiguaPeggy Deshay Kirstein, MD

## 2017-12-29 ENCOUNTER — Encounter: Payer: Self-pay | Admitting: Physical Therapy

## 2017-12-29 ENCOUNTER — Ambulatory Visit: Payer: Medicaid Other | Attending: Obstetrics and Gynecology | Admitting: Physical Therapy

## 2017-12-29 ENCOUNTER — Other Ambulatory Visit: Payer: Self-pay

## 2017-12-29 DIAGNOSIS — M5440 Lumbago with sciatica, unspecified side: Secondary | ICD-10-CM | POA: Diagnosis not present

## 2017-12-29 DIAGNOSIS — M6281 Muscle weakness (generalized): Secondary | ICD-10-CM | POA: Diagnosis present

## 2017-12-29 NOTE — Therapy (Signed)
[facility] [address] [place], [place], [address] Phone: [phone]   Fax:  [phone]  Physical Therapy Evaluation/Discharge   Patient Details  Name: Shannon Holland MRN: [id number] Date of Birth: 12-03-1995 Referring Provider (PT): Dr. Catalina Antigua    Encounter Date: 12/29/2017  PT End of Session - 12/29/17 1227    Visit Number  1    Number of Visits  1    PT Start Time  1145    PT Stop Time  1229    PT Time Calculation (min)  44 min    Activity Tolerance  Patient limited by pain    Behavior During Therapy  Restless       Past Medical History:  Diagnosis Date  . Anemia     Past Surgical History:  Procedure Laterality Date  . MOUTH SURGERY      There were no vitals filed for this visit.   Subjective Assessment - 12/29/17 1153    Subjective  Patient presetns with back pain which began at the start of her 2nd trimester.  She has low back pain which radiates up her middle and upper back.  She has a hard time getting comfortable, even on her side (incr leg pain ) .  She has difficulty sitting, standing, walking,  She has numbness /tingling , her legs get weak and she has had near fall.  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  Her job requires her to be on her feet >90% of the time.     Limitations  Sitting;Lifting;Standing;Walking;House hold activities;Other (comment)   sleeping, work    Currently in Pain?  Yes    Pain Score  10-Worst pain ever    Pain Location  Back    Pain Orientation  Lower    Pain Relieving Factors  positon changes          [facility] PT Assessment - 12/29/17 0001      Assessment   Medical Diagnosis  low back pain in pregnancy     Referring Provider (PT)  Dr. Gigi Gin Constant     Onset Date/Surgical Date  --   3 mos +   Next MD Visit  12/9    Prior Therapy  no       Precautions   Precautions  Other (comment)    Precaution Comments   pregnancy       Restrictions   Weight Bearing Restrictions  No      Balance Screen   Has the patient fallen in the past 6 months  No   near falls at work    Has the patient had a decrease in activity level because of a fear of falling?   Yes    Is the patient reluctant to leave their home because of a fear of falling?   Yes      Home Environment   Living Environment  Private residence    Living Arrangements  Spouse/significant other;Children    Additional Comments  has a 2 yr old son       Prior Function   Level of Independence  Needs assistance with ADLs;Needs assistance with homemaking    Vocation  Full time employment;Part time employment    Vocation Requirements  standing, squatting, lifting     Leisure  not right now     Comments  fiance assists       Cognition   Overall  Cognitive Status  Within Functional Limits for tasks assessed    Behaviors  Restless      Observation/Other Assessments   Focus on Therapeutic Outcomes (FOTO)   NT due to MCD       Sensation   Light Touch  Appears Intact      Squat   Comments  unable       Single Leg Stance   Comments  unable to lift leg       Posture/Postural Control   Posture/Postural Control  Postural limitations    Postural Limitations  Rounded Shoulders;Forward head;Increased lumbar lordosis;Anterior pelvic tilt      AROM   Overall AROM Comments  unable to extend fully upright      Strength   Overall Strength Comments  bilateral LEs present as 3/5 due to pain     Right Hip Flexion  3-/5    Left Hip Flexion  3-/5      Palpation   Palpation comment  did not tolerate, withdraws from palpation, massages her hip and back thorughout the session      Transfers   Comments  increased time       Ambulation/Gait   Gait Comments  increased time          Objective measurements completed on examination: See above findings.      PT Education - 12/29/17 1300    Education Details  Positioning, HEP, precautions, eval  findings, heat/ice, dizziness, water intake     Person(s) Educated  Patient    Methods  Explanation;Handout    Comprehension  Verbalized understanding                  Plan - 12/29/17 1245    Clinical Impression Statement  Patient presents for high complexity eval of low back pain during pregnancy.  She has 6 weeks until due date.  She demonstrates severe pain which radiates into bilateral LEs and into middle and upper back. She was not comfortable in any position.  She was weak symmetrically in bilateral LEs, unable to extend knee sitting edge of bed.  She was given basic quadruped exercises to relieve her spasms.  She needed assistance to get in and out of this position.  Shown how to do in standing. She also had lightheadedness, nausea today which has been going on for some time.  She gets no benefit from medication and abdominal support increases her pain.  I recommend she be taken out of work due to safety issues with prolonged periods of standing and progressive LE weakness.  She may return to PT if warranted after her baby is born.      History and Personal Factors relevant to plan of care:  already has a 24 yr old, works 2 jobs, nausea and lightheadedness, unrelenting pain     Clinical Presentation  Unstable    Clinical Presentation due to:  significant weakness, fall risk , weak, intolerance for position     Clinical Decision Making  High    Rehab Potential  Fair    PT Frequency  One time visit    PT Next Visit Plan  NA    PT Home Exercise Plan  quadruped     Consulted and Agree with Plan of Care  Patient       Patient will benefit from skilled therapeutic intervention in order to improve the following deficits and impairments:  Abnormal gait, Decreased balance, Decreased activity tolerance, Decreased strength, Increased fascial restricitons, Postural dysfunction,  Pain, Decreased mobility, Difficulty walking, Increased muscle spasms, Impaired sensation  Visit  Diagnosis: Bilateral low back pain with sciatica, sciatica laterality unspecified, unspecified chronicity  Muscle weakness (generalized)     Problem List Patient Active Problem List   Diagnosis Date Noted  . Supervision of normal pregnancy in third trimester 07/23/2017    PAA,JENNIFER 12/29/2017, 1:21 PM  [facility] [address] [place], [place], [address] Phone: [phone]   Fax:  [phone]  Name: Shannon Holland MRN: [id number] Date of Birth: 04/11/1995    Karie MainlandJennifer Paa, PT 12/29/17 1:24 PM Phone: [phone] Fax: [phone]

## 2017-12-29 NOTE — Patient Instructions (Signed)
Prepared By: Karie MainlandJennifer Micayla Brathwaite [address]  [place], [place]  Phone: [phone]  Step 1  Step 2  Quadruped Cat Camel reps: 10  sets: 2  hold: 5  daily: 1  weekly: 7 Setup  Begin on all fours with your arms directly under your shoulders. Movement  Slowly sag your back down to the floor, then round your back up toward the ceiling and repeat. Tip  Make sure to use your entire back for the motion and keep your movements slow and controlled. Step 1  Step 2  Quadruped Transversus Abdominis Bracing reps: 10  sets: 2  hold: 5  daily: 1  weekly: 7 Setup  Begin on all fours. Movement  Gently release your abdominal muscles, letting your belly relax toward the floor. Then tighten your muscles, pulling your navel in and up towards your spine. Then relax and repeat. Tip  Make sure not to hold your breath as you tighten your muscles Step 1  Step 2  Quadruped Rocking Slow reps: 10  sets: 2  hold: 15  daily: 1  weekly: 7 Setup  Begin on all fours, with your arms positioned shoulder width apart and your knees resting on a cushion. Movement  Slowly rock back and forth, shifting your weight between your arms and your legs. Tip  Make sure to keep your back straight and chin tucked during the exercise. Maintain equal weight distribution between both sides of your body.

## 2018-01-11 ENCOUNTER — Other Ambulatory Visit (HOSPITAL_COMMUNITY)
Admission: RE | Admit: 2018-01-11 | Discharge: 2018-01-11 | Disposition: A | Discharge status: Home / Self Care | Payer: Medicaid Other | Source: Ambulatory Visit | Attending: Advanced Practice Midwife | Admitting: Advanced Practice Midwife

## 2018-01-11 ENCOUNTER — Ambulatory Visit (INDEPENDENT_AMBULATORY_CARE_PROVIDER_SITE_OTHER): Payer: Medicaid Other | Admitting: Advanced Practice Midwife

## 2018-01-11 VITALS — BP 109/68 | HR 98 | Wt 158.8 lb

## 2018-01-11 DIAGNOSIS — O26893 Other specified pregnancy related conditions, third trimester: Secondary | ICD-10-CM | POA: Diagnosis not present

## 2018-01-11 DIAGNOSIS — M549 Dorsalgia, unspecified: Secondary | ICD-10-CM

## 2018-01-11 DIAGNOSIS — M7918 Myalgia, other site: Secondary | ICD-10-CM

## 2018-01-11 DIAGNOSIS — N898 Other specified noninflammatory disorders of vagina: Secondary | ICD-10-CM

## 2018-01-11 DIAGNOSIS — Z3483 Encounter for supervision of other normal pregnancy, third trimester: Secondary | ICD-10-CM

## 2018-01-11 DIAGNOSIS — O99891 Other specified diseases and conditions complicating pregnancy: Secondary | ICD-10-CM

## 2018-01-11 DIAGNOSIS — O9989 Other specified diseases and conditions complicating pregnancy, childbirth and the puerperium: Secondary | ICD-10-CM

## 2018-01-11 DIAGNOSIS — M5442 Lumbago with sciatica, left side: Secondary | ICD-10-CM

## 2018-01-11 DIAGNOSIS — M5441 Lumbago with sciatica, right side: Secondary | ICD-10-CM

## 2018-01-11 MED ORDER — TERCONAZOLE 0.4 % VA CREA: 1.0000 | TOPICAL_CREAM | Freq: Every day | VAGINAL | 0 refills | Status: DC

## 2018-01-11 NOTE — Patient Instructions (Signed)
Third Trimester of Pregnancy The third trimester is from week 28 through week 40 (months 7 through 9). The third trimester is a time when the unborn baby (fetus) is growing rapidly. At the end of the ninth month, the fetus is about 20 inches in length and weighs 6-10 pounds. Body changes during your third trimester Your body will continue to go through many changes during pregnancy. The changes vary from woman to woman. During the third trimester:  Your weight will continue to increase. You can expect to gain 25-35 pounds (11-16 kg) by the end of the pregnancy.  You may begin to get stretch marks on your hips, abdomen, and breasts.  You may urinate more often because the fetus is moving lower into your pelvis and pressing on your bladder.  You may develop or continue to have heartburn. This is caused by increased hormones that slow down muscles in the digestive tract.  You may develop or continue to have constipation because increased hormones slow digestion and cause the muscles that push waste through your intestines to relax.  You may develop hemorrhoids. These are swollen veins (varicose veins) in the rectum that can itch or be painful.  You may develop swollen, bulging veins (varicose veins) in your legs.  You may have increased body aches in the pelvis, back, or thighs. This is due to weight gain and increased hormones that are relaxing your joints.  You may have changes in your hair. These can include thickening of your hair, rapid growth, and changes in texture. Some women also have hair loss during or after pregnancy, or hair that feels dry or thin. Your hair will most likely return to normal after your baby is born.  Your breasts will continue to grow and they will continue to become tender. A yellow fluid (colostrum) may leak from your breasts. This is the first milk you are producing for your baby.  Your belly button may stick out.  You may notice more swelling in your hands,  face, or ankles.  You may have increased tingling or numbness in your hands, arms, and legs. The skin on your belly may also feel numb.  You may feel short of breath because of your expanding uterus.  You may have more problems sleeping. This can be caused by the size of your belly, increased need to urinate, and an increase in your body's metabolism.  You may notice the fetus "dropping," or moving lower in your abdomen (lightening).  You may have increased vaginal discharge.  You may notice your joints feel loose and you may have pain around your pelvic bone.  What to expect at prenatal visits You will have prenatal exams every 2 weeks until week 36. Then you will have weekly prenatal exams. During a routine prenatal visit:  You will be weighed to make sure you and the baby are growing normally.  Your blood pressure will be taken.  Your abdomen will be measured to track your baby's growth.  The fetal heartbeat will be listened to.  Any test results from the previous visit will be discussed.  You may have a cervical check near your due date to see if your cervix has softened or thinned (effaced).  You will be tested for Group B streptococcus. This happens between 35 and 37 weeks.  Your health care provider may ask you:  What your birth plan is.  How you are feeling.  If you are feeling the baby move.  If you have had   any abnormal symptoms, such as leaking fluid, bleeding, severe headaches, or abdominal cramping.  If you are using any tobacco products, including cigarettes, chewing tobacco, and electronic cigarettes.  If you have any questions.  Other tests or screenings that may be performed during your third trimester include:  Blood tests that check for low iron levels (anemia).  Fetal testing to check the health, activity level, and growth of the fetus. Testing is done if you have certain medical conditions or if there are problems during the  pregnancy.  Nonstress test (NST). This test checks the health of your baby to make sure there are no signs of problems, such as the baby not getting enough oxygen. During this test, a belt is placed around your belly. The baby is made to move, and its heart rate is monitored during movement.  What is false labor? False labor is a condition in which you feel small, irregular tightenings of the muscles in the womb (contractions) that usually go away with rest, changing position, or drinking water. These are called Braxton Hicks contractions. Contractions may last for hours, days, or even weeks before true labor sets in. If contractions come at regular intervals, become more frequent, increase in intensity, or become painful, you should see your health care provider. What are the signs of labor?  Abdominal cramps.  Regular contractions that start at 10 minutes apart and become stronger and more frequent with time.  Contractions that start on the top of the uterus and spread down to the lower abdomen and back.  Increased pelvic pressure and dull back pain.  A watery or bloody mucus discharge that comes from the vagina.  Leaking of amniotic fluid. This is also known as your "water breaking." It could be a slow trickle or a gush. Let your health care provider know if it has a color or strange odor. If you have any of these signs, call your health care provider right away, even if it is before your due date. Follow these instructions at home: Medicines  Follow your health care provider's instructions regarding medicine use. Specific medicines may be either safe or unsafe to take during pregnancy.  Take a prenatal vitamin that contains at least 600 micrograms (mcg) of folic acid.  If you develop constipation, try taking a stool softener if your health care provider approves. Eating and drinking  Eat a balanced diet that includes fresh fruits and vegetables, whole grains, good sources of protein  such as meat, eggs, or tofu, and low-fat dairy. Your health care provider will help you determine the amount of weight gain that is right for you.  Avoid raw meat and uncooked cheese. These carry germs that can cause birth defects in the baby.  If you have low calcium intake from food, talk to your health care provider about whether you should take a daily calcium supplement.  Eat four or five small meals rather than three large meals a day.  Limit foods that are high in fat and processed sugars, such as fried and sweet foods.  To prevent constipation: ? Drink enough fluid to keep your urine clear or pale yellow. ? Eat foods that are high in fiber, such as fresh fruits and vegetables, whole grains, and beans. Activity  Exercise only as directed by your health care provider. Most women can continue their usual exercise routine during pregnancy. Try to exercise for 30 minutes at least 5 days a week. Stop exercising if you experience uterine contractions.  Avoid heavy   lifting.  Do not exercise in extreme heat or humidity, or at high altitudes.  Wear low-heel, comfortable shoes.  Practice good posture.  You may continue to have sex unless your health care provider tells you otherwise. Relieving pain and discomfort  Take frequent breaks and rest with your legs elevated if you have leg cramps or low back pain.  Take warm sitz baths to soothe any pain or discomfort caused by hemorrhoids. Use hemorrhoid cream if your health care provider approves.  Wear a good support bra to prevent discomfort from breast tenderness.  If you develop varicose veins: ? Wear support pantyhose or compression stockings as told by your healthcare provider. ? Elevate your feet for 15 minutes, 3-4 times a day. Prenatal care  Write down your questions. Take them to your prenatal visits.  Keep all your prenatal visits as told by your health care provider. This is important. Safety  Wear your seat belt at  all times when driving.  Make a list of emergency phone numbers, including numbers for family, friends, the hospital, and police and fire departments. General instructions  Avoid cat litter boxes and soil used by cats. These carry germs that can cause birth defects in the baby. If you have a cat, ask someone to clean the litter box for you.  Do not travel far distances unless it is absolutely necessary and only with the approval of your health care provider.  Do not use hot tubs, steam rooms, or saunas.  Do not drink alcohol.  Do not use any products that contain nicotine or tobacco, such as cigarettes and e-cigarettes. If you need help quitting, ask your health care provider.  Do not use any medicinal herbs or unprescribed drugs. These chemicals affect the formation and growth of the baby.  Do not douche or use tampons or scented sanitary pads.  Do not cross your legs for long periods of time.  To prepare for the arrival of your baby: ? Take prenatal classes to understand, practice, and ask questions about labor and delivery. ? Make a trial run to the hospital. ? Visit the hospital and tour the maternity area. ? Arrange for maternity or paternity leave through employers. ? Arrange for family and friends to take care of pets while you are in the hospital. ? Purchase a rear-facing car seat and make sure you know how to install it in your car. ? Pack your hospital bag. ? Prepare the baby's nursery. Make sure to remove all pillows and stuffed animals from the baby's crib to prevent suffocation.  Visit your dentist if you have not gone during your pregnancy. Use a soft toothbrush to brush your teeth and be gentle when you floss. Contact a health care provider if:  You are unsure if you are in labor or if your water has broken.  You become dizzy.  You have mild pelvic cramps, pelvic pressure, or nagging pain in your abdominal area.  You have lower back pain.  You have persistent  nausea, vomiting, or diarrhea.  You have an unusual or bad smelling vaginal discharge.  You have pain when you urinate. Get help right away if:  Your water breaks before 37 weeks.  You have regular contractions less than 5 minutes apart before 37 weeks.  You have a fever.  You are leaking fluid from your vagina.  You have spotting or bleeding from your vagina.  You have severe abdominal pain or cramping.  You have rapid weight loss or weight gain.    You have shortness of breath with chest pain.  You notice sudden or extreme swelling of your face, hands, ankles, feet, or legs.  Your baby makes fewer than 10 movements in 2 hours.  You have severe headaches that do not go away when you take medicine.  You have vision changes. Summary  The third trimester is from week 28 through week 40, months 7 through 9. The third trimester is a time when the unborn baby (fetus) is growing rapidly.  During the third trimester, your discomfort may increase as you and your baby continue to gain weight. You may have abdominal, leg, and back pain, sleeping problems, and an increased need to urinate.  During the third trimester your breasts will keep growing and they will continue to become tender. A yellow fluid (colostrum) may leak from your breasts. This is the first milk you are producing for your baby.  False labor is a condition in which you feel small, irregular tightenings of the muscles in the womb (contractions) that eventually go away. These are called Braxton Hicks contractions. Contractions may last for hours, days, or even weeks before true labor sets in.  Signs of labor can include: abdominal cramps; regular contractions that start at 10 minutes apart and become stronger and more frequent with time; watery or bloody mucus discharge that comes from the vagina; increased pelvic pressure and dull back pain; and leaking of amniotic fluid. This information is not intended to replace advice  given to you by your health care provider. Make sure you discuss any questions you have with your health care provider. Document Released: 01/14/2001 Document Revised: 06/28/2015 Document Reviewed: 03/23/2012 Elsevier Interactive Patient Education  2017 [employer]  

## 2018-01-11 NOTE — Progress Notes (Signed)
   PRENATAL VISIT NOTE  Subjective:  Shannon Holland is a 22 y.o. G2P1001 at 13108w6d being seen today for ongoing prenatal care.  She is currently monitored for the following issues for this low-risk pregnancy and has Supervision of normal pregnancy in third trimester on their problem list.  Patient reports backache and pain radiating into pelvis and bilaterally in her legs.  Contractions: Irregular. Vag. Bleeding: None.  Movement: Present. Denies leaking of fluid.   The following portions of the patient's history were reviewed and updated as appropriate: allergies, current medications, past family history, past medical history, past social history, past surgical history and problem list. Problem list updated.  Objective:   Vitals:   01/11/18 1307  BP: 109/68  Pulse: 98  Weight: 72 kg    Fetal Status: Fetal Heart Rate (bpm): 158 Fundal Height: 35 cm Movement: Present  Presentation: Vertex  General:  Alert, oriented and cooperative. Patient is in no acute distress.  Skin: Skin is warm and dry. No rash noted.   Cardiovascular: Normal heart rate noted  Respiratory: Normal respiratory effort, no problems with respiration noted  Abdomen: Soft, gravid, appropriate for gestational age.  Pain/Pressure: Present     Pelvic: Cervical exam performed Dilation: Closed Effacement (%): 50 Station: -3  Extremities: Normal range of motion.  Edema: Trace  Mental Status: Normal mood and affect. Normal behavior. Normal judgment and thought content.   Assessment and Plan:  Pregnancy: G2P1001 at 87108w6d  1. Encounter for supervision of other normal pregnancy in third trimester --Anticipatory guidance about next visits/weeks of pregnancy given. - Strep Gp B Culture+Rflx  2. Back pain affecting pregnancy in third trimester   3. Vaginal discharge during pregnancy in third trimester --Thick white discharge with itching. C/W yeast. Rx for Terazol 7. - Cervicovaginal ancillary only( [facility])  4.  Acute bilateral low back pain with bilateral sciatica --Rest, ice, Tylenol, pregnancy support belt ---Rx for maternity support belt written  5. Pain in symphysis pubis during pregnancy --See above, discussed referral for PT if worsens/persists  Term labor symptoms and general obstetric precautions including but not limited to vaginal bleeding, contractions, leaking of fluid and fetal movement were reviewed in detail with the patient. Please refer to After Visit Summary for other counseling recommendations.  Return in about 2 weeks (around 01/25/2018).  Future Appointments  Date Time Provider Department Center  01/11/2018  2:00 PM Leftwich-Kirby, Wilmer FloorLisa A, CNM [facility] None  01/12/2018  1:30 PM Lanell Personsaa, [facility]  01/19/2018  1:30 PM Lum Keasaa, [facility] L[facility]  01/25/2018  1:30 PM Paa, Lise AuerJennifer L, PT [facility]    Sharen CounterLisa Leftwich-Kirby, CNM

## 2018-01-12 ENCOUNTER — Ambulatory Visit: Payer: Medicaid Other | Admitting: Physical Therapy

## 2018-01-12 LAB — CERVICOVAGINAL ANCILLARY ONLY
Bacterial vaginitis: NEGATIVE
Candida vaginitis: POSITIVE — AB
Chlamydia: NEGATIVE
Neisseria Gonorrhea: NEGATIVE

## 2018-01-15 LAB — STREP GP B CULTURE+RFLX: Strep Gp B Culture+Rflx: NEGATIVE

## 2018-01-19 ENCOUNTER — Encounter: Payer: Self-pay | Admitting: Obstetrics & Gynecology

## 2018-01-19 ENCOUNTER — Encounter: Payer: Medicaid Other | Admitting: Physical Therapy

## 2018-01-19 ENCOUNTER — Ambulatory Visit: Payer: Medicaid Other | Admitting: Obstetrics & Gynecology

## 2018-01-19 DIAGNOSIS — Z3483 Encounter for supervision of other normal pregnancy, third trimester: Secondary | ICD-10-CM

## 2018-01-19 NOTE — Patient Instructions (Signed)
Vaginal Delivery Vaginal delivery means that you will give birth by pushing your baby out of your birth canal (vagina). A team of health care providers will help you before, during, and after vaginal delivery. Birth experiences are unique for every woman and every pregnancy, and birth experiences vary depending on where you choose to give birth. What should I do to prepare for my baby's birth? Before your baby is born, it is important to talk with your health care provider about:  Your labor and delivery preferences. These may include: ? Medicines that you may be given. ? How you will manage your pain. This might include non-medical pain relief techniques or injectable pain relief such as epidural analgesia. ? How you and your baby will be monitored during labor and delivery. ? Who may be in the labor and delivery room with you. ? Your feelings about surgical delivery of your baby (cesarean delivery, or C-section) if this becomes necessary. ? Your feelings about receiving donated blood through an IV tube (blood transfusion) if this becomes necessary.  Whether you are able: ? To take pictures or videos of the birth. ? To eat during labor and delivery. ? To move around, walk, or change positions during labor and delivery.  What to expect after your baby is born, such as: ? Whether delayed umbilical cord clamping and cutting is offered. ? Who will care for your baby right after birth. ? Medicines or tests that may be recommended for your baby. ? Whether breastfeeding is supported in your hospital or birth center. ? How long you will be in the hospital or birth center.  How any medical conditions you have may affect your baby or your labor and delivery experience.  To prepare for your baby's birth, you should also:  Attend all of your health care visits before delivery (prenatal visits) as recommended by your health care provider. This is important.  Prepare your home for your baby's  arrival. Make sure that you have: ? Diapers. ? Baby clothing. ? Feeding equipment. ? Safe sleeping arrangements for you and your baby.  Install a car seat in your vehicle. Have your car seat checked by a certified car seat installer to make sure that it is installed safely.  Think about who will help you with your new baby at home for at least the first several weeks after delivery.  What can I expect when I arrive at the birth center or hospital? Once you are in labor and have been admitted into the hospital or birth center, your health care provider may:  Review your pregnancy history and any concerns you have.  Insert an IV tube into one of your veins. This is used to give you fluids and medicines.  Check your blood pressure, pulse, temperature, and heart rate (vital signs).  Check whether your bag of water (amniotic sac) has broken (ruptured).  Talk with you about your birth plan and discuss pain control options.  Monitoring Your health care provider may monitor your contractions (uterine monitoring) and your baby's heart rate (fetal monitoring). You may need to be monitored:  Often, but not continuously (intermittently).  All the time or for long periods at a time (continuously). Continuous monitoring may be needed if: ? You are taking certain medicines, such as medicine to relieve pain or make your contractions stronger. ? You have pregnancy or labor complications.  Monitoring may be done by:  Placing a special stethoscope or a handheld monitoring device on your abdomen to   check your baby's heartbeat, and feeling your abdomen for contractions. This method of monitoring does not continuously record your baby's heartbeat or your contractions.  Placing monitors on your abdomen (external monitors) to record your baby's heartbeat and the frequency and length of contractions. You may not have to wear external monitors all the time.  Placing monitors inside of your uterus  (internal monitors) to record your baby's heartbeat and the frequency, length, and strength of your contractions. ? Your health care provider may use internal monitors if he or she needs more information about the strength of your contractions or your baby's heart rate. ? Internal monitors are put in place by passing a thin, flexible wire through your vagina and into your uterus. Depending on the type of monitor, it may remain in your uterus or on your baby's head until birth. ? Your health care provider will discuss the benefits and risks of internal monitoring with you and will ask for your permission before inserting the monitors.  Telemetry. This is a type of continuous monitoring that can be done with external or internal monitors. Instead of having to stay in bed, you are able to move around during telemetry. Ask your health care provider if telemetry is an option for you.  Physical exam Your health care provider may perform a physical exam. This may include:  Checking whether your baby is positioned: ? With the head toward your vagina (head-down). This is most common. ? With the head toward the top of your uterus (head-up or breech). If your baby is in a breech position, your health care provider may try to turn your baby to a head-down position so you can deliver vaginally. If it does not seem that your baby can be born vaginally, your provider may recommend surgery to deliver your baby. In rare cases, you may be able to deliver vaginally if your baby is head-up (breech delivery). ? Lying sideways (transverse). Babies that are lying sideways cannot be delivered vaginally.  Checking your cervix to determine: ? Whether it is thinning out (effacing). ? Whether it is opening up (dilating). ? How low your baby has moved into your birth canal.  What are the three stages of labor and delivery?  Normal labor and delivery is divided into the following three stages: Stage 1  Stage 1 is the  longest stage of labor, and it can last for hours or days. Stage 1 includes: ? Early labor. This is when contractions may be irregular, or regular and mild. Generally, early labor contractions are more than 10 minutes apart. ? Active labor. This is when contractions get longer, more regular, more frequent, and more intense. ? The transition phase. This is when contractions happen very close together, are very intense, and may last longer than during any other part of labor.  Contractions generally feel mild, infrequent, and irregular at first. They get stronger, more frequent (about every 2-3 minutes), and more regular as you progress from early labor through active labor and transition.  Many women progress through stage 1 naturally, but you may need help to continue making progress. If this happens, your health care provider may talk with you about: ? Rupturing your amniotic sac if it has not ruptured yet. ? Giving you medicine to help make your contractions stronger and more frequent.  Stage 1 ends when your cervix is completely dilated to 4 inches (10 cm) and completely effaced. This happens at the end of the transition phase. Stage 2  Once   your cervix is completely effaced and dilated to 4 inches (10 cm), you may start to feel an urge to push. It is common for the body to naturally take a rest before feeling the urge to push, especially if you received an epidural or certain other pain medicines. This rest period may last for up to 1-2 hours, depending on your unique labor experience.  During stage 2, contractions are generally less painful, because pushing helps relieve contraction pain. Instead of contraction pain, you may feel stretching and burning pain, especially when the widest part of your baby's head passes through the vaginal opening (crowning).  Your health care provider will closely monitor your pushing progress and your baby's progress through the vagina during stage 2.  Your  health care provider may massage the area of skin between your vaginal opening and anus (perineum) or apply warm compresses to your perineum. This helps it stretch as the baby's head starts to crown, which can help prevent perineal tearing. ? In some cases, an incision may be made in your perineum (episiotomy) to allow the baby to pass through the vaginal opening. An episiotomy helps to make the opening of the vagina larger to allow more room for the baby to fit through.  It is very important to breathe and focus so your health care provider can control the delivery of your baby's head. Your health care provider may have you decrease the intensity of your pushing, to help prevent perineal tearing.  After delivery of your baby's head, the shoulders and the rest of the body generally deliver very quickly and without difficulty.  Once your baby is delivered, the umbilical cord may be cut right away, or this may be delayed for 1-2 minutes, depending on your baby's health. This may vary among health care providers, hospitals, and birth centers.  If you and your baby are healthy enough, your baby may be placed on your chest or abdomen to help maintain the baby's temperature and to help you bond with each other. Some mothers and babies start breastfeeding at this time. Your health care team will dry your baby and help keep your baby warm during this time.  Your baby may need immediate care if he or she: ? Showed signs of distress during labor. ? Has a medical condition. ? Was born too early (prematurely). ? Had a bowel movement before birth (meconium). ? Shows signs of difficulty transitioning from being inside the uterus to being outside of the uterus. If you are planning to breastfeed, your health care team will help you begin a feeding. Stage 3  The third stage of labor starts immediately after the birth of your baby and ends after you deliver the placenta. The placenta is an organ that develops  during pregnancy to provide oxygen and nutrients to your baby in the womb.  Delivering the placenta may require some pushing, and you may have mild contractions. Breastfeeding can stimulate contractions to help you deliver the placenta.  After the placenta is delivered, your uterus should tighten (contract) and become firm. This helps to stop bleeding in your uterus. To help your uterus contract and to control bleeding, your health care provider may: ? Give you medicine by injection, through an IV tube, by mouth, or through your rectum (rectally). ? Massage your abdomen or perform a vaginal exam to remove any blood clots that are left in your uterus. ? Empty your bladder by placing a thin, flexible tube (catheter) into your bladder. ? Encourage   you to breastfeed your baby. After labor is over, you and your baby will be monitored closely to ensure that you are both healthy until you are ready to go home. Your health care team will teach you how to care for yourself and your baby. This information is not intended to replace advice given to you by your health care provider. Make sure you discuss any questions you have with your health care provider. Document Released: 10/30/2007 Document Revised: 08/10/2015 Document Reviewed: 02/04/2015 Elsevier Interactive Patient Education  2018 [employer]  

## 2018-01-19 NOTE — Progress Notes (Signed)
Patient reports good fetal movement with irregular contractions.  

## 2018-01-19 NOTE — Progress Notes (Signed)
   PRENATAL VISIT NOTE  Subjective:  Shannon Holland is a 22 y.o. G2P1001 at 4574w0d being seen today for ongoing prenatal care.  Shannon Holland is currently monitored for the following issues for this low-risk pregnancy and has Supervision of normal pregnancy in third trimester on their problem list.  Patient reports occasional contractions.  Contractions: Irregular. Vag. Bleeding: None.  Movement: Present. Denies leaking of fluid.   The following portions of the patient's history were reviewed and updated as appropriate: allergies, current medications, past family history, past medical history, past social history, past surgical history and problem list. Problem list updated.  Objective:   Vitals:   01/19/18 1513  BP: 117/69  Pulse: 91  Weight: 160 lb 9.6 oz (72.8 kg)    Fetal Status: Fetal Heart Rate (bpm): 148   Movement: Present     General:  Alert, oriented and cooperative. Patient is in no acute distress.  Skin: Skin is warm and dry. No rash noted.   Cardiovascular: Normal heart rate noted  Respiratory: Normal respiratory effort, no problems with respiration noted  Abdomen: Soft, gravid, appropriate for gestational age.  Pain/Pressure: Present     Pelvic: Cervical exam deferred        Extremities: Normal range of motion.  Edema: Trace  Mental Status: Normal mood and affect. Normal behavior. Normal judgment and thought content.   Assessment and Plan:  Pregnancy: G2P1001 at 4074w0d  1. Encounter for supervision of other normal pregnancy in third trimester Doing well  Term labor symptoms and general obstetric precautions including but not limited to vaginal bleeding, contractions, leaking of fluid and fetal movement were reviewed in detail with the patient. Please refer to After Visit Summary for other counseling recommendations.  Return in about 1 week (around 01/26/2018).  No future appointments.  Scheryl DarterJames Hubert Derstine, MD

## 2018-01-25 ENCOUNTER — Encounter: Payer: Medicaid Other | Admitting: Physical Therapy

## 2018-02-01 ENCOUNTER — Ambulatory Visit (INDEPENDENT_AMBULATORY_CARE_PROVIDER_SITE_OTHER): Payer: Medicaid Other | Admitting: Obstetrics & Gynecology

## 2018-02-01 ENCOUNTER — Encounter: Payer: Self-pay | Admitting: Obstetrics & Gynecology

## 2018-02-01 DIAGNOSIS — Z3483 Encounter for supervision of other normal pregnancy, third trimester: Secondary | ICD-10-CM

## 2018-02-01 NOTE — Progress Notes (Signed)
   PRENATAL VISIT NOTE  Subjective:  Shannon Holland is a 22 y.o. G2P1001 at 93102w6d being seen today for ongoing prenatal care.  She is currently monitored for the following issues for this low-risk pregnancy and has Supervision of normal pregnancy in third trimester on their problem list.  Patient reports occasional contractions.  Contractions: Irritability. Vag. Bleeding: None.  Movement: Present. Denies leaking of fluid.   The following portions of the patient's history were reviewed and updated as appropriate: allergies, current medications, past family history, past medical history, past social history, past surgical history and problem list. Problem list updated.  Objective:   Vitals:   02/01/18 1517  BP: 114/73  Pulse: 89  Weight: 165 lb (74.8 kg)    Fetal Status: Fetal Heart Rate (bpm): 144   Movement: Present  Presentation: Vertex  General:  Alert, oriented and cooperative. Patient is in no acute distress.  Skin: Skin is warm and dry. No rash noted.   Cardiovascular: Normal heart rate noted  Respiratory: Normal respiratory effort, no problems with respiration noted  Abdomen: Soft, gravid, appropriate for gestational age.  Pain/Pressure: Present     Pelvic: Cervical exam performed Dilation: Fingertip Effacement (%): 50 Station: -3  Extremities: Normal range of motion.  Edema: Trace  Mental Status: Normal mood and affect. Normal behavior. Normal judgment and thought content.   Assessment and Plan:  Pregnancy: G2P1001 at 36102w6d  1. Encounter for supervision of other normal pregnancy in third trimester Doing well  Term labor symptoms and general obstetric precautions including but not limited to vaginal bleeding, contractions, leaking of fluid and fetal movement were reviewed in detail with the patient. Please refer to After Visit Summary for other counseling recommendations.  Return in about 1 week (around 02/08/2018).  No future appointments.  Scheryl DarterJames Lyndie Vanderloop, MD

## 2018-02-01 NOTE — Patient Instructions (Signed)

## 2018-02-01 NOTE — Progress Notes (Signed)
Pt request cervix check.  

## 2018-02-03 NOTE — L&D Delivery Note (Addendum)
Delivery Note At 10:43 AM a viable and healthy female was delivered via Vaginal, Spontaneous (Presentation: OP).  APGAR: 8, 9; weight  3490g.   Placenta status: delivered spontaneously, intact.  Cord: 3-vessel with the following complications: none.  Cord pH: none tested.  Anesthesia: Epidural Episiotomy: None Lacerations: None Suture Repair: None Est. Blood Loss (mL): 38  Mom to postpartum.  Baby to Couplet care / Skin to Skin.  Dollene Cleveland 02/07/2018, 11:56 AM  Patient arrived in early labor. She was started on pitocin for labor augmentation. SROM for clear fluid. Patient pushed with good effort, but minimal movement of fetal head. Fetus noted to be OP, and patient rested after pushing for about one hour. Just prior to delivery FHR was noted to be in the 70's. Dr. Alysia Penna notified and he was in the OR. Back up attending called due to minimal movement of fetal head with pushing. Patient coached and ended up with NSVD with OP presentation and compound right hand. Baby was vigorous at birth.   I was present and gloved with the resident for the delivery. I agree with the above note.    Thressa Sheller DNP, CNM  02/07/18  1:01 PM

## 2018-02-06 ENCOUNTER — Other Ambulatory Visit: Payer: Self-pay

## 2018-02-06 ENCOUNTER — Inpatient Hospital Stay (HOSPITAL_BASED_OUTPATIENT_CLINIC_OR_DEPARTMENT_OTHER): Payer: Medicaid Other

## 2018-02-06 ENCOUNTER — Inpatient Hospital Stay (HOSPITAL_COMMUNITY)
Admission: AD | Admit: 2018-02-06 | Discharge: 2018-02-09 | DRG: 807 | Disposition: A | Discharge status: Home / Self Care | Payer: Medicaid Other | Attending: Obstetrics and Gynecology | Admitting: Obstetrics and Gynecology

## 2018-02-06 ENCOUNTER — Encounter (HOSPITAL_COMMUNITY): Payer: Self-pay | Admitting: *Deleted

## 2018-02-06 DIAGNOSIS — O368130 Decreased fetal movements, third trimester, not applicable or unspecified: Principal | ICD-10-CM | POA: Diagnosis present

## 2018-02-06 DIAGNOSIS — Z3A39 39 weeks gestation of pregnancy: Secondary | ICD-10-CM | POA: Diagnosis not present

## 2018-02-06 DIAGNOSIS — O368190 Decreased fetal movements, unspecified trimester, not applicable or unspecified: Secondary | ICD-10-CM | POA: Diagnosis present

## 2018-02-06 DIAGNOSIS — Z880 Allergy status to penicillin: Secondary | ICD-10-CM

## 2018-02-06 DIAGNOSIS — O479 False labor, unspecified: Secondary | ICD-10-CM | POA: Diagnosis present

## 2018-02-06 NOTE — MAU Provider Note (Signed)
History     CSN: [id number]  Arrival date and time: 02/06/18 2305   First Provider Initiated Contact with Patient 02/06/18 2330      Chief Complaint  Patient presents with  . Decreased Fetal Movement   PELE WEIDERT is a 23 y.o. G2P1 at [redacted]w[redacted]d who presents to MAU with complaints of decreased fetal movement. She reports she has not felt fetal movement since 1830 on 02/05/2018. She reports trying ice, cold fluid, sugar last night with no resolution in decreased fetal movement. She reports DFM is associated with contractions that has been occurring on and off for weeks per patient. Denies LOF or vaginal bleeding. Denies complications during pregnancy prior to visit today.    OB History    Gravida  2   Para  1   Term  1   Preterm      AB      Living  1     SAB      TAB      Ectopic      Multiple  0   Live Births  1           Past Medical History:  Diagnosis Date  . Anemia     Past Surgical History:  Procedure Laterality Date  . MOUTH SURGERY      Family History  Problem Relation Age of Onset  . Hypertension Mother     Social History   Tobacco Use  . Smoking status: Never Smoker  . Smokeless tobacco: Never Used  Substance Use Topics  . Alcohol use: No  . Drug use: No    Allergies:  Allergies  Allergen Reactions  . Shrimp [Shellfish Allergy] Anaphylaxis  . Penicillins Rash    Has patient had a PCN reaction causing immediate rash, facial/tongue/throat swelling, SOB or lightheadedness with hypotension: Yes Has patient had a PCN reaction causing severe rash involving mucus membranes or skin necrosis: No Has patient had a PCN reaction that required hospitalization No Has patient had a PCN reaction occurring within the last 10 years: Yes If all of the above answers are "NO", then may proceed with Cephalosporin use.     Medications Prior to Admission  Medication Sig Dispense Refill Last Dose  . Prenatal Vit-Fe Fumarate-FA (PRENATAL  MULTIVITAMIN) TABS tablet Take 1 tablet by mouth at bedtime.    02/06/2018 at Unknown time  . acetaminophen (TYLENOL) 500 MG tablet Take 1 tablet (500 mg total) by mouth every 6 (six) hours as needed. (Patient not taking: Reported on 12/29/2017) 30 tablet 0 Not Taking  . cyclobenzaprine (FLEXERIL) 10 MG tablet Take 1 tablet (10 mg total) by mouth every 8 (eight) hours as needed for muscle spasms. (Patient not taking: Reported on 12/29/2017) 30 tablet 1 Not Taking  . Elastic Bandages & Supports (ABDOMINAL BINDER/ELASTIC MED) MISC Please provide one binder (Patient not taking: Reported on 12/29/2017) 1 each 2 Not Taking  . terconazole (TERAZOL 7) 0.4 % vaginal cream Place 1 applicator vaginally at bedtime. 45 g 0 Taking    Review of Systems  Constitutional: Negative.   Respiratory: Negative.   Cardiovascular: Negative.   Gastrointestinal: Positive for abdominal pain. Negative for constipation, diarrhea, nausea and vomiting.       Contractions  Genitourinary: Negative.    Physical Exam   Blood pressure 111/63, pulse 92, temperature 98.5 F (36.9 C), temperature source Oral, resp. rate 18, last menstrual period 05/05/2017, unknown if currently breastfeeding.  Physical Exam  Nursing note and vitals reviewed.  Constitutional: She is oriented to person, place, and time. She appears well-developed and well-nourished. No distress.  Cardiovascular: Normal rate, regular rhythm and normal heart sounds.  Respiratory: Effort normal and breath sounds normal. No respiratory distress.  GI: Soft.  Gravid, appropriate for gestational age. No fetal movement palpated by CNM during 5 minutes of palpation   Musculoskeletal: Normal range of motion.        General: No edema.  Neurological: She is alert and oriented to person, place, and time.   Dilation: 2 Effacement (%): 70 Cervical Position: Posterior Station: -2 Presentation: Vertex Exam by:: Apollos Tenbrink cnm   MAU Course  Procedures  MDM Orders  Placed This Encounter  Procedures  . [place] MFM Fetal BPP Wo Non Stress   BPP 8/8. Patient reports continued no fetal movement while in MAU.  Consult with Dr Macon Large with examination and management, admit to L&D for DFM after 39 weeks.   Labor team aware, care taken over by Dr Earlene Plater   Assessment and Plan  1. Decreased fetal movement  2. [redacted] weeks gestation  Admit to L&D  Care taken over by labor team   Sharyon Cable CNM 02/06/2018, 12:40 AM

## 2018-02-06 NOTE — MAU Note (Signed)
Pt presents to MAU c/o no FM since 1830 02/05/17. Pt denies bleeding LOF. No UC.

## 2018-02-07 ENCOUNTER — Inpatient Hospital Stay (HOSPITAL_COMMUNITY): Payer: Medicaid Other | Admitting: Anesthesiology

## 2018-02-07 ENCOUNTER — Encounter (HOSPITAL_COMMUNITY): Payer: Self-pay

## 2018-02-07 DIAGNOSIS — O368130 Decreased fetal movements, third trimester, not applicable or unspecified: Secondary | ICD-10-CM | POA: Diagnosis not present

## 2018-02-07 DIAGNOSIS — Z3A39 39 weeks gestation of pregnancy: Secondary | ICD-10-CM | POA: Diagnosis not present

## 2018-02-07 DIAGNOSIS — O479 False labor, unspecified: Secondary | ICD-10-CM | POA: Diagnosis present

## 2018-02-07 DIAGNOSIS — O368190 Decreased fetal movements, unspecified trimester, not applicable or unspecified: Secondary | ICD-10-CM | POA: Diagnosis present

## 2018-02-07 DIAGNOSIS — Z880 Allergy status to penicillin: Secondary | ICD-10-CM | POA: Diagnosis not present

## 2018-02-07 LAB — CBC
HEMATOCRIT: 37.8 % (ref 36.0–46.0)
HEMOGLOBIN: 12.3 g/dL (ref 12.0–15.0)
MCH: 28.3 pg (ref 26.0–34.0)
MCHC: 32.5 g/dL (ref 30.0–36.0)
MCV: 86.9 fL (ref 80.0–100.0)
Platelets: 217 10*3/uL (ref 150–400)
RBC: 4.35 MIL/uL (ref 3.87–5.11)
RDW: 13.8 % (ref 11.5–15.5)
WBC: 20 10*3/uL — ABNORMAL HIGH (ref 4.0–10.5)
nRBC: 0 % (ref 0.0–0.2)

## 2018-02-07 LAB — RPR: RPR Ser Ql: NONREACTIVE

## 2018-02-07 LAB — TYPE AND SCREEN
ABO/RH(D): B POS
Antibody Screen: NEGATIVE

## 2018-02-07 MED ORDER — LACTATED RINGERS IV SOLN
500.0000 mL | Freq: Once | INTRAVENOUS | Status: AC
  Administered 2018-02-07: 500 mL via INTRAVENOUS

## 2018-02-07 MED ORDER — TERBUTALINE SULFATE 1 MG/ML IJ SOLN
0.2500 mg | Freq: Once | INTRAMUSCULAR | Status: DC | PRN
  Filled 2018-02-07: qty 1

## 2018-02-07 MED ORDER — FENTANYL CITRATE (PF) 100 MCG/2ML IJ SOLN
100.0000 ug | INTRAMUSCULAR | Status: DC | PRN
  Administered 2018-02-07: 100 ug via INTRAVENOUS
  Filled 2018-02-07: qty 2

## 2018-02-07 MED ORDER — WITCH HAZEL-GLYCERIN EX PADS
1.0000 "application " | MEDICATED_PAD | CUTANEOUS | Status: DC | PRN
  Administered 2018-02-07: 1 via TOPICAL

## 2018-02-07 MED ORDER — SENNOSIDES-DOCUSATE SODIUM 8.6-50 MG PO TABS
2.0000 | ORAL_TABLET | ORAL | Status: DC
  Administered 2018-02-07 – 2018-02-08 (×2): 2 via ORAL
  Filled 2018-02-07 (×2): qty 2

## 2018-02-07 MED ORDER — IBUPROFEN 600 MG PO TABS
600.0000 mg | ORAL_TABLET | Freq: Four times a day (QID) | ORAL | Status: DC
  Administered 2018-02-07 – 2018-02-09 (×8): 600 mg via ORAL
  Filled 2018-02-07 (×8): qty 1

## 2018-02-07 MED ORDER — OXYTOCIN BOLUS FROM INFUSION
500.0000 mL | Freq: Once | INTRAVENOUS | Status: AC
  Administered 2018-02-07: 500 mL via INTRAVENOUS

## 2018-02-07 MED ORDER — ZOLPIDEM TARTRATE 5 MG PO TABS: 5.0000 mg | ORAL_TABLET | Freq: Every evening | ORAL | Status: DC | PRN

## 2018-02-07 MED ORDER — OXYTOCIN 40 UNITS IN LACTATED RINGERS INFUSION - SIMPLE MED: 2.5000 [IU]/h | INTRAVENOUS | Status: DC

## 2018-02-07 MED ORDER — FENTANYL 2.5 MCG/ML BUPIVACAINE 1/10 % EPIDURAL INFUSION (WH - ANES)
14.0000 mL/h | INTRAMUSCULAR | Status: DC | PRN
  Administered 2018-02-07: 14 mL/h via EPIDURAL

## 2018-02-07 MED ORDER — LACTATED RINGERS IV SOLN: 500.0000 mL | INTRAVENOUS | Status: DC | PRN

## 2018-02-07 MED ORDER — EPHEDRINE 5 MG/ML INJ
10.0000 mg | INTRAVENOUS | Status: DC | PRN
  Filled 2018-02-07: qty 2

## 2018-02-07 MED ORDER — DIPHENHYDRAMINE HCL 50 MG/ML IJ SOLN: 12.5000 mg | INTRAMUSCULAR | Status: DC | PRN

## 2018-02-07 MED ORDER — PRENATAL MULTIVITAMIN CH
1.0000 | ORAL_TABLET | Freq: Every day | ORAL | Status: DC
  Administered 2018-02-07 – 2018-02-09 (×3): 1 via ORAL
  Filled 2018-02-07 (×3): qty 1

## 2018-02-07 MED ORDER — PHENYLEPHRINE 40 MCG/ML (10ML) SYRINGE FOR IV PUSH (FOR BLOOD PRESSURE SUPPORT)
80.0000 ug | PREFILLED_SYRINGE | INTRAVENOUS | Status: DC | PRN
  Filled 2018-02-07: qty 10

## 2018-02-07 MED ORDER — PHENYLEPHRINE 40 MCG/ML (10ML) SYRINGE FOR IV PUSH (FOR BLOOD PRESSURE SUPPORT)
PREFILLED_SYRINGE | INTRAVENOUS | Status: AC
  Filled 2018-02-07: qty 20

## 2018-02-07 MED ORDER — ACETAMINOPHEN 325 MG PO TABS: 650.0000 mg | ORAL_TABLET | ORAL | Status: DC | PRN

## 2018-02-07 MED ORDER — ONDANSETRON HCL 4 MG/2ML IJ SOLN: 4.0000 mg | Freq: Four times a day (QID) | INTRAMUSCULAR | Status: DC | PRN

## 2018-02-07 MED ORDER — ONDANSETRON HCL 4 MG PO TABS: 4.0000 mg | ORAL_TABLET | ORAL | Status: DC | PRN

## 2018-02-07 MED ORDER — TETANUS-DIPHTH-ACELL PERTUSSIS 5-2.5-18.5 LF-MCG/0.5 IM SUSP: 0.5000 mL | Freq: Once | INTRAMUSCULAR | Status: DC

## 2018-02-07 MED ORDER — LIDOCAINE-EPINEPHRINE (PF) 2 %-1:200000 IJ SOLN
INTRAMUSCULAR | Status: DC | PRN
  Administered 2018-02-07: 4 mL via EPIDURAL
  Administered 2018-02-07: 3 mL via EPIDURAL

## 2018-02-07 MED ORDER — SIMETHICONE 80 MG PO CHEW: 80.0000 mg | CHEWABLE_TABLET | ORAL | Status: DC | PRN

## 2018-02-07 MED ORDER — ONDANSETRON HCL 4 MG/2ML IJ SOLN: 4.0000 mg | INTRAMUSCULAR | Status: DC | PRN

## 2018-02-07 MED ORDER — DIBUCAINE 1 % RE OINT: 1.0000 "application " | TOPICAL_OINTMENT | RECTAL | Status: DC | PRN

## 2018-02-07 MED ORDER — OXYTOCIN 40 UNITS IN LACTATED RINGERS INFUSION - SIMPLE MED
1.0000 m[IU]/min | INTRAVENOUS | Status: DC
  Administered 2018-02-07: 2 m[IU]/min via INTRAVENOUS
  Filled 2018-02-07: qty 1000

## 2018-02-07 MED ORDER — COCONUT OIL OIL
1.0000 "application " | TOPICAL_OIL | Status: DC | PRN
  Filled 2018-02-07: qty 120

## 2018-02-07 MED ORDER — OXYCODONE-ACETAMINOPHEN 5-325 MG PO TABS
1.0000 | ORAL_TABLET | ORAL | Status: DC | PRN
  Administered 2018-02-07 – 2018-02-08 (×3): 1 via ORAL
  Filled 2018-02-07 (×3): qty 1

## 2018-02-07 MED ORDER — FENTANYL 2.5 MCG/ML BUPIVACAINE 1/10 % EPIDURAL INFUSION (WH - ANES)
INTRAMUSCULAR | Status: AC
  Filled 2018-02-07: qty 100

## 2018-02-07 MED ORDER — BENZOCAINE-MENTHOL 20-0.5 % EX AERO
1.0000 "application " | INHALATION_SPRAY | CUTANEOUS | Status: DC | PRN
  Administered 2018-02-07: 1 via TOPICAL
  Filled 2018-02-07: qty 56

## 2018-02-07 MED ORDER — DIPHENHYDRAMINE HCL 25 MG PO CAPS: 25.0000 mg | ORAL_CAPSULE | Freq: Four times a day (QID) | ORAL | Status: DC | PRN

## 2018-02-07 MED ORDER — LACTATED RINGERS IV SOLN
INTRAVENOUS | Status: DC
  Administered 2018-02-07 (×3): via INTRAVENOUS

## 2018-02-07 MED ORDER — SOD CITRATE-CITRIC ACID 500-334 MG/5ML PO SOLN: 30.0000 mL | ORAL | Status: DC | PRN

## 2018-02-07 MED ORDER — LIDOCAINE HCL (PF) 1 % IJ SOLN
30.0000 mL | INTRAMUSCULAR | Status: DC | PRN
  Filled 2018-02-07: qty 30

## 2018-02-07 MED ORDER — FLEET ENEMA 7-19 GM/118ML RE ENEM: 1.0000 | ENEMA | RECTAL | Status: DC | PRN

## 2018-02-07 NOTE — Plan of Care (Signed)
  Problem: Activity: Goal: Ability to tolerate increased activity will improve Outcome: Completed/Met Note:  Assisted patient to the bathroom. Patient ambulated well independently.     Problem: Education: Goal: Knowledge of condition will improve Note:  Admission education, safety and unit protocols reviewed with patient and family. Maxwell Caul, Leretha Dykes [place]

## 2018-02-07 NOTE — Anesthesia Procedure Notes (Signed)
Epidural Patient location during procedure: OB Start time: 02/07/2018 7:40 AM End time: 02/07/2018 7:55 AM  Staffing Anesthesiologist: Elmer PickerWoodrum, Athea Haley L, MD Performed: anesthesiologist   Preanesthetic Checklist Completed: patient identified, pre-op evaluation, timeout performed, IV checked, risks and benefits discussed and monitors and equipment checked  Epidural Patient position: sitting Prep: site prepped and draped and DuraPrep Patient monitoring: continuous pulse ox, blood pressure, heart rate and cardiac monitor Approach: midline Location: L3-L4 Injection technique: LOR air  Needle:  Needle type: Tuohy  Needle gauge: 17 G Needle length: 9 cm Needle insertion depth: 4 cm Catheter type: closed end flexible Catheter size: 19 Gauge Catheter at skin depth: 9 cm Test dose: negative  Assessment Sensory level: T8 Events: blood not aspirated, injection not painful, no injection resistance, negative IV test and no paresthesia  Additional Notes Patient identified. Risks/Benefits/Options discussed with patient including but not limited to bleeding, infection, nerve damage, paralysis, failed block, incomplete pain control, headache, blood pressure changes, nausea, vomiting, reactions to medication both or allergic, itching and postpartum back pain. Confirmed with bedside nurse the patient's most recent platelet count. Confirmed with patient that they are not currently taking any anticoagulation, have any bleeding history or any family history of bleeding disorders. Patient expressed understanding and wished to proceed. All questions were answered. Sterile technique was used throughout the entire procedure. Please see nursing notes for vital signs. Test dose was given through epidural catheter and negative prior to continuing to dose epidural or start infusion. Warning signs of high block given to the patient including shortness of breath, tingling/numbness in hands, complete motor block, or  any concerning symptoms with instructions to call for help. Patient was given instructions on fall risk and not to get out of bed. All questions and concerns addressed with instructions to call with any issues or inadequate analgesia.  Reason for block:procedure for pain

## 2018-02-07 NOTE — Lactation Note (Signed)
This note was copied from a baby's chart. Lactation Consultation Note  Patient Name: Shannon Latrelle DodrillGabrielle Holland ZOXWR'UToday's Date: 02/07/2018 Reason for consult: Initial assessment;Term  P2 mother whose infant is now 646 hours old.  Mother breast fed her first child (now 652 1/25 years old) for 3-4 months  Baby was beginning to awaken when I arrived.  Mother stated she could not awaken him to feed and was concerned.  I educated her on feeding in the first 24 hours of life.  We discussed feeding cues and how to awaken a sleepy baby.    Mother's breasts are soft and non tender and nipples are everted and intact.    Mother was able to hand express a few drops of colostrum which I finger fed back to baby.  Assisted to latch onto the left breast in the football hold without difficulty.  Baby had wide gape and flanged lips and with gentle stimulation was able to maintain an effective suck.  Taught mother breast compressions, hand positioning and showed her techniques to keep him awake while feeding.  I observed him feeding for 15 minutes before leaving the room.  Mother denied pain with latching.  Provided a colostrum container for any EBM she may receive from hand expression.  Milk storage times reviewed.  Demonstrated finger feeding.  Mother will call for latch assistance as needed.  Family in room.  Mom made aware of O/P services, breastfeeding support groups, community resources, and our phone # for post-discharge questions.    Maternal Data Formula Feeding for Exclusion: No Has patient been taught Hand Expression?: Yes Does the patient have breastfeeding experience prior to this delivery?: Yes  Feeding Feeding Type: Breast Fed  LATCH Score Latch: Grasps breast easily, tongue down, lips flanged, rhythmical sucking.  Audible Swallowing: A few with stimulation  Type of Nipple: Everted at rest and after stimulation  Comfort (Breast/Nipple): Soft / non-tender  Hold (Positioning): Assistance needed to  correctly position infant at breast and maintain latch.  LATCH Score: 8  Interventions Interventions: Breast feeding basics reviewed;Assisted with latch;Skin to skin;Breast massage;Hand express;Breast compression;Position options;Support pillows;Adjust position  Lactation Tools Discussed/Used     Consult Status Consult Status: Follow-up Date: 02/08/18 Follow-up type: In-patient    [name] 02/07/2018, 5:42 PM

## 2018-02-07 NOTE — Anesthesia Pain Management Evaluation Note (Signed)
  CRNA Pain Management Visit Note  Patient: Shannon Holland, 23 y.o., female  "Hello I am a member of the anesthesia team at [facility]. We have an anesthesia team available at all times to provide care throughout the hospital, including epidural management and anesthesia for C-section. I don't know your plan for the delivery whether it a natural birth, water birth, IV sedation, nitrous supplementation, doula or epidural, but we want to meet your pain goals."   1.Was your pain managed to your expectations on prior hospitalizations?   Yes   2.What is your expectation for pain management during this hospitalization?     Epidural  3.How can we help you reach that goal? unsure  Record the patient's initial score and the patient's pain goal.   Pain: 6  Pain Goal: 7 The [facility] wants you to be able to say your pain was always managed very well.  Cephus Shelling 02/07/2018

## 2018-02-07 NOTE — Progress Notes (Signed)
Kingsley CallanderGabrielle A Fludd is a 23 y.o. G2P1001 at 917w5d by LMP admitted for early labor and decreased fetal movement.  Subjective: Doing well. Pain well controlled.  Objective: BP 115/71   Pulse 86   Temp 98.6 F (37 C) (Axillary)   Resp 16   Ht 5\' 6"  (1.676 m)   Wt 74.8 kg   LMP 05/05/2017   BMI 26.63 kg/m  No intake/output data recorded. No intake/output data recorded.  FHT:  FHR: 135 bpm, variability: moderate,  accelerations:  Present,  decelerations:  Absent UC:   irregular, every 3-4 minutes. Inadequate. SVE:   Dilation: 3.5 Effacement (%): 60 Station: -3 Exam by:: Dr. Primitivo GauzeFletcher  Labs: Lab Results  Component Value Date   WBC 20.0 (H) 02/07/2018   HGB 12.3 02/07/2018   HCT 37.8 02/07/2018   MCV 86.9 02/07/2018   PLT 217 02/07/2018    Assessment / Plan: Admitted for onset of labor and decreased fetal movement. Patient progressed without augmentation, will add pitocin 2x2 as ctx pattern is contraindication to cytotec  Labor: Add pitocin 2x2 Preeclampsia:  none Fetal Wellbeing:  Category I Pain Control:  Epidural I/D:  n/a Anticipated MOD:  NSVD  Myrene BuddyJacob Andreah Goheen 02/07/2018, 4:39 AM

## 2018-02-07 NOTE — Anesthesia Postprocedure Evaluation (Signed)
Anesthesia Post Note  Patient: Shannon Holland  Procedure(s) Performed: AN AD HOC LABOR EPIDURAL     Patient location during evaluation: Mother Baby Anesthesia Type: Epidural Level of consciousness: awake and alert and oriented Pain management: satisfactory to patient Vital Signs Assessment: post-procedure vital signs reviewed and stable Respiratory status: respiratory function stable Cardiovascular status: stable Postop Assessment: no headache, no backache, epidural receding, patient able to bend at knees, no signs of nausea or vomiting and adequate PO intake Anesthetic complications: no    Last Vitals:  Vitals:   02/07/18 1300 02/07/18 1420  BP: 120/73 112/81  Pulse: 90 85  Resp: 18 16  Temp: 37.1 C 37.1 C  SpO2: 100%     Last Pain:  Vitals:   02/07/18 1528  TempSrc:   PainSc: 5    Pain Goal: Patients Stated Pain Goal: 2 (02/07/18 1420)               Deaja Rizo

## 2018-02-07 NOTE — H&P (Addendum)
LABOR AND DELIVERY ADMISSION HISTORY AND PHYSICAL NOTE  Shannon Holland is a 23 y.o. female G2P1001 with IUP at [redacted]w[redacted]d by LMP presenting for early labor and decreased fetal movement. She denies leakage of fluid or vaginal bleeding.  Prenatal History/Complications: PNC at [facility] Pregnancy complications:  - echogenic cardiac focus at 14 week scan-> repeat scan at 20 weeks normal - candida on Cervicovaginal-> treated with terazol  Past Medical History: Past Medical History:  Diagnosis Date  . Anemia     Past Surgical History: Past Surgical History:  Procedure Laterality Date  . MOUTH SURGERY      Obstetrical History: OB History    Gravida  2   Para  1   Term  1   Preterm      AB      Living  1     SAB      TAB      Ectopic      Multiple  0   Live Births  1           Social History: Social History   Socioeconomic History  . Marital status: Single    Spouse name: Not on file  . Number of children: Not on file  . Years of education: Not on file  . Highest education level: Not on file  Occupational History  . Not on file  Social Needs  . Financial resource strain: Not on file  . Food insecurity:    Worry: Not on file    Inability: Not on file  . Transportation needs:    Medical: Not on file    Non-medical: Not on file  Tobacco Use  . Smoking status: Never Smoker  . Smokeless tobacco: Never Used  Substance and Sexual Activity  . Alcohol use: No  . Drug use: No  . Sexual activity: Yes    Partners: Male    Birth control/protection: None  Lifestyle  . Physical activity:    Days per week: Not on file    Minutes per session: Not on file  . Stress: Not on file  Relationships  . Social connections:    Talks on phone: Not on file    Gets together: Not on file    Attends religious service: Not on file    Active member of club or organization: Not on file    Attends meetings of clubs or organizations: Not on file    Relationship status:  Not on file  Other Topics Concern  . Not on file  Social History Narrative  . Not on file    Family History: Family History  Problem Relation Age of Onset  . Hypertension Mother     Allergies: Allergies  Allergen Reactions  . Shrimp [Shellfish Allergy] Anaphylaxis  . Penicillins Rash    Has patient had a PCN reaction causing immediate rash, facial/tongue/throat swelling, SOB or lightheadedness with hypotension: Yes Has patient had a PCN reaction causing severe rash involving mucus membranes or skin necrosis: No Has patient had a PCN reaction that required hospitalization No Has patient had a PCN reaction occurring within the last 10 years: Yes If all of the above answers are "NO", then may proceed with Cephalosporin use.     Medications Prior to Admission  Medication Sig Dispense Refill Last Dose  . Prenatal Vit-Fe Fumarate-FA (PRENATAL MULTIVITAMIN) TABS tablet Take 1 tablet by mouth at bedtime.    02/06/2018 at Unknown time     Review of Systems  All systems  reviewed and negative except as stated in HPI  Physical Exam Blood pressure 111/63, pulse 92, temperature 98.5 F (36.9 C), temperature source Oral, resp. rate 18, last menstrual period 05/05/2017, unknown if currently breastfeeding. General appearance: resting comfortably, no acute distress Lungs: normal respiratory effort Heart: regular rate Abdomen: soft, non-tender; gravid, FH appropriate for GA Extremities: No calf swelling or tenderness Presentation: cephalic Fetal monitoring: cat 2. One decel noted, resolved with position change Uterine activity: adequate contraction ever 5-6 minutes Dilation: 2 Effacement (%): 70 Station: -2 Exam by:: veronica rogers cnm   Prenatal labs: ABO, Rh: --/--/B POS (01/05 7353) Antibody: PENDING (01/05 0047) Rubella: 1.41 (06/20 1609) RPR: Non Reactive (10/10 1037)  HBsAg: Negative (06/20 1609)  HIV: Non Reactive (10/10 1037)  GC/Chlamydia: negative GBS:    negative 1/fasting/2-hr GTT: 150/75/88 Genetic screening:  Quad screen low risk Anatomy US: - echogenic cardiac focus at 14 week scan-> repeat scan at 20 weeks normal  Prenatal Transfer Tool  Maternal Diabetes: No Genetic Screening: Normal Maternal Ultrasounds/Referrals: Normal Fetal Ultrasounds or other Referrals:  Normal Maternal Substance Abuse:  No Significant Maternal Medications:  None Significant Maternal Lab Results: None  Results for orders placed or performed during the hospital encounter of 02/06/18 (from the past 24 hour(s))  CBC   Collection Time: 02/07/18 12:47 AM  Result Value Ref Range   WBC 20.0 (H) 4.0 - 10.5 K/uL   RBC 4.35 3.87 - 5.11 MIL/uL   Hemoglobin 12.3 12.0 - 15.0 g/dL   HCT 29.9 24.2 - 68.3 %   MCV 86.9 80.0 - 100.0 fL   MCH 28.3 26.0 - 34.0 pg   MCHC 32.5 30.0 - 36.0 g/dL   RDW 41.9 62.2 - 29.7 %   Platelets 217 150 - 400 K/uL   nRBC 0.0 0.0 - 0.2 %  Type and screen [facility]   Collection Time: 02/07/18 12:47 AM  Result Value Ref Range   ABO/RH(D) B POS    Antibody Screen PENDING    Sample Expiration      02/10/2018 Performed at [facility], [address] [place], [place] [address]     Patient Active Problem List   Diagnosis Date Noted  . Indication for care in labor or delivery 02/07/2018  . Decreased fetal movement 02/07/2018  . Uterine contractions during pregnancy 02/07/2018  . Supervision of normal pregnancy in third trimester 07/23/2017    Assessment: Shannon Holland is a 23 y.o. G2P1001 at [redacted]w[redacted]d here for early labor and decreased fetal movement. BPP performed in MAU with result still pending. Will manage expectantly for now given the strength of contractions. If fails to progress can add cytotec and possible foley bulb or pitocin.   #Labor: expectant for now #Pain: Epidural when able #FWB: Cat 2, only one decel noted which improved with position change #ID: gbs neg #MOF: breast #MOC: depo vs  iud #Circ:  Yes (out)  Myrene Buddy MD PGY-2 Family Medicine Resident 02/07/2018, 1:28 AM   Attestation: I have seen this patient and agree with the resident's documentation. I have examined them separately, and we have discussed the plan of care.  Cristal Deer. Earlene Plater, DO OB/GYN Fellow

## 2018-02-07 NOTE — Anesthesia Preprocedure Evaluation (Signed)
Anesthesia Evaluation  Patient identified by MRN, date of birth, ID band Patient awake    Reviewed: Allergy & Precautions, NPO status , Patient's Chart, lab work & pertinent test results  Airway Mallampati: I  TM Distance: >3 FB Neck ROM: Full    Dental no notable dental hx.    Pulmonary neg pulmonary ROS,    Pulmonary exam normal breath sounds clear to auscultation       Cardiovascular negative cardio ROS Normal cardiovascular exam Rhythm:Regular Rate:Normal     Neuro/Psych negative neurological ROS  negative psych ROS   GI/Hepatic negative GI ROS, Neg liver ROS,   Endo/Other  negative endocrine ROS  Renal/GU negative Renal ROS  negative genitourinary   Musculoskeletal negative musculoskeletal ROS (+)   Abdominal   Peds negative pediatric ROS (+)  Hematology negative hematology ROS (+)   Anesthesia Other Findings   Reproductive/Obstetrics (+) Pregnancy                             Anesthesia Physical Anesthesia Plan  ASA: II  Anesthesia Plan: Epidural   Post-op Pain Management:    Induction:   PONV Risk Score and Plan: Treatment may vary due to age or medical condition  Airway Management Planned: Natural Airway  Additional Equipment:   Intra-op Plan:   Post-operative Plan:   Informed Consent: I have reviewed the patients History and Physical, chart, labs and discussed the procedure including the risks, benefits and alternatives for the proposed anesthesia with the patient or authorized representative who has indicated his/her understanding and acceptance.       Plan Discussed with: Anesthesiologist  Anesthesia Plan Comments: (Patient identified. Risks, benefits, options discussed with patient including but not limited to bleeding, infection, nerve damage, paralysis, failed block, incomplete pain control, headache, blood pressure changes, nausea, vomiting, reactions to  medication, itching, and post partum back pain. Confirmed with bedside nurse the patient's most recent platelet count. Confirmed with the patient that they are not taking any anticoagulation, have any bleeding history or any family history of bleeding disorders. Patient expressed understanding and wishes to proceed. All questions were answered. )        Anesthesia Quick Evaluation  

## 2018-02-08 NOTE — Plan of Care (Signed)
Patient is progessing well, working on breast feeding today. Nipples are sore, discussed depth, postions and expression.

## 2018-02-08 NOTE — Progress Notes (Signed)
Post Partum Day 1 Subjective: no complaints, up ad lib, voiding, tolerating PO and + flatus  Objective: Blood pressure 102/61, pulse 75, temperature 98 F (36.7 C), temperature source Oral, resp. rate 18, height 5\' 6"  (1.676 m), weight 74.8 kg, last menstrual period 05/05/2017, SpO2 100 %, unknown if currently breastfeeding.  Physical Exam:  General: alert, cooperative and appears stated age Lochia: appropriate Uterine Fundus: firm Incision: N/A DVT Evaluation: No evidence of DVT seen on physical exam. Negative Homan's sign. No cords or calf tenderness.  Recent Labs    02/07/18 0047  HGB 12.3  HCT 37.8    Assessment/Plan: Plan for discharge tomorrow, Breastfeeding and Lactation consult   LOS: 1 day   Myrene Buddy 02/08/2018, 7:31 AM

## 2018-02-08 NOTE — Lactation Note (Signed)
This note was copied from a baby's chart. Lactation Consultation Note  Patient Name: Shannon Holland Date: 02/08/2018 Reason for consult: Term;Follow-up assessment;Difficult latch  P2 mother whose infant is now 73 hours old.    RN had assisted mother to latch prior to my arrival.  Reiterated the importance of a wide gape and flanged lips.  Demonstrated breast compressions and encouraged mother to continue compressions intermittently.  Discussed baby's position and mother's hand position reminding her to keep fingers back away from nipple and areola.    Baby self released and nipple was rounded.  Taught mother how to observe for hunger cues.  When he started showing more cues I encouraged her to latch again and continue feeding.  Positioned baby slightly higher and deeper on the breast and he began to suck rhythmically again.  He fed for approximately 12 minutes before I left the room and was still feeding when I exited the room.  Mother will call as needed for further latch assistance.   Maternal Data Formula Feeding for Exclusion: No Has patient been taught Hand Expression?: Yes Does the patient have breastfeeding experience prior to this delivery?: Yes  Feeding Feeding Type: Breast Fed  LATCH Score Latch: Grasps breast easily, tongue down, lips flanged, rhythmical sucking.  Audible Swallowing: A few with stimulation  Type of Nipple: Everted at rest and after stimulation  Comfort (Breast/Nipple): Soft / non-tender  Hold (Positioning): Assistance needed to correctly position infant at breast and maintain latch.  LATCH Score: 8  Interventions Interventions: Breast feeding basics reviewed;Assisted with latch;Skin to skin;Breast massage;Hand express;Breast compression;Adjust position;Support pillows;Position options  Lactation Tools Discussed/Used     Consult Status Consult Status: Follow-up Date: 02/02/18 Follow-up type: In-patient    Dora Sims 02/08/2018, 12:46 PM

## 2018-02-09 MED ORDER — IBUPROFEN 600 MG PO TABS: 600.0000 mg | ORAL_TABLET | Freq: Four times a day (QID) | ORAL | 0 refills | Status: AC

## 2018-02-09 NOTE — Progress Notes (Signed)
Post Partum Day 2 Subjective: Shannon Holland  is a 23 y.o. [id number] s/p SVD at [redacted]w[redacted]d.  She reports she is doing well. No acute events overnight. She denies any problems with ambulating, voiding or po intake. Denies nausea or vomiting.  Pain is well controlled on ibuprofen.  Lochia is moderate and improving.  Objective: Blood pressure 108/78, pulse 73, temperature 98.5 F (36.9 C), temperature source Oral, resp. rate 16, height 5\' 6"  (1.676 m), weight 74.8 kg, last menstrual period 05/05/2017, SpO2 97 %, currently breastfeeding.  Physical Exam:  General: alert, cooperative, fatigued and no distress Lochia: appropriate Uterine Fundus: firm, U-1 DVT Evaluation: No evidence of DVT seen on physical exam. Negative Homan's sign. No cords or calf tenderness. No significant calf/ankle edema.  Recent Labs    02/07/18 0047  HGB 12.3  HCT 37.8    Assessment/Plan: Discharge home, Breastfeeding and Contraception IUD or Depo   LOS: 2 days   Raelyn Mora 02/09/2018, 7:59 AM

## 2018-02-09 NOTE — Discharge Summary (Signed)
Postpartum Discharge Summary     Patient Name: Shannon CallanderGabrielle A Settle DOB: 12/17/1995 MRN: [id number]  Date of admission: 02/06/2018 Delivering Provider: Dollene ClevelandANDERSON, HANNAH C   Date of discharge: 02/09/2018  Admitting diagnosis: 39 wks decreased fetal movement all day Intrauterine pregnancy: 5223w5d     Secondary diagnosis:  Principal Problem:   Uterine contractions during pregnancy Active Problems:   Indication for care in labor or delivery   Decreased fetal movement  Additional problems: none     Discharge diagnosis: Term Pregnancy Delivered                                                                                                Post partum procedures:none  Augmentation: Pitocin  Complications: None  Hospital course:  Onset of Labor With Vaginal Delivery     23 y.o. yo [id number] at 3523w5d was admitted in Latent Labor on 02/06/2018. Patient had an uncomplicated labor course as follows:  Membrane Rupture Time/Date: 8:40 AM ,02/07/2018   Intrapartum Procedures: Episiotomy: None [1]                                         Lacerations:  None [1]  Patient had a delivery of a Viable infant. 02/07/2018  Information for the patient's newborn:  Tiney RougeCaldwell, Boy Velencia [[id number]][[id number]]  Delivery Method: Vaginal, Spontaneous(Filed from Delivery Summary)    Pateint had an uncomplicated postpartum course.  She is ambulating, tolerating a regular diet, passing flatus, and urinating well. Patient is discharged home in stable condition on 02/09/18.   Magnesium Sulfate recieved: No BMZ received: No  Physical exam  Vitals:   02/08/18 0530 02/08/18 1700 02/08/18 2259 02/09/18 0521  BP: 102/61 109/78 96/67 108/78  Pulse: 75 89 88 73  Resp: 18 16 16 16   Temp: 98 F (36.7 C) 98.3 F (36.8 C) 97.8 F (36.6 C) 98.5 F (36.9 C)  TempSrc: Oral Oral Oral Oral  SpO2:  100% 99% 97%  Weight:      Height:       General: alert, cooperative and no distress Lochia: appropriate Uterine Fundus: firm,  U-1 DVT Evaluation: No evidence of DVT seen on physical exam. Negative Homan's sign. No cords or calf tenderness. No Calf/Ankle edema is present Labs: Lab Results  Component Value Date   WBC 20.0 (H) 02/07/2018   HGB 12.3 02/07/2018   HCT 37.8 02/07/2018   MCV 86.9 02/07/2018   PLT 217 02/07/2018   CMP Latest Ref Rng & Units 08/24/2014  Glucose 65 - 99 mg/dL 81  BUN 6 - 20 mg/dL 11  Creatinine 4.030.44 - 4.741.00 mg/dL 2.590.71  Sodium 563135 - 875145 mmol/L 139  Potassium 3.5 - 5.1 mmol/L 3.9  Chloride 101 - 111 mmol/L 106  CO2 22 - 32 mmol/L 26  Calcium 8.9 - 10.3 mg/dL 9.7    Discharge instruction: per After Visit Summary and "Baby and Me Booklet".  After visit meds:  Allergies as of 02/09/2018      Reactions   Shrimp [  shellfish Allergy] Anaphylaxis   Penicillins Rash   Has patient had a PCN reaction causing immediate rash, facial/tongue/throat swelling, SOB or lightheadedness with hypotension: Yes Has patient had a PCN reaction causing severe rash involving mucus membranes or skin necrosis: No Has patient had a PCN reaction that required hospitalization No Has patient had a PCN reaction occurring within the last 10 years: Yes If all of the above answers are "NO", then may proceed with Cephalosporin use.      Medication List    TAKE these medications   ibuprofen 600 MG tablet Commonly known as:  ADVIL,MOTRIN Take 1 tablet (600 mg total) by mouth every 6 (six) hours.   prenatal multivitamin Tabs tablet Take 1 tablet by mouth daily at 12 noon.       Diet: routine diet  Activity: Advance as tolerated. Pelvic rest for 6 weeks.   Outpatient follow up:6 weeks Follow up Appt:No future appointments. Follow up Visit: Follow-up Information    [facility]   Specialty:  Obstetrics and Gynecology Why:  Someone will call you to schedule postpartum visit Contact information: [address], [address] [place]  [phone]           Please schedule this patient for Postpartum visit in: 6 weeks with the following provider: Any provider For C/S patients schedule nurse incision check in weeks 2 weeks: no Low risk pregnancy complicated by: none Delivery mode:  SVD Anticipated Birth Control:  IUD or Depo PP Procedures needed: none  Schedule Integrated BH visit: no   Newborn Data: Live born female  Birth Weight: 7 lb 11.1 oz (3490 g) APGAR: 8, 9  Newborn Delivery   Birth date/time:  02/07/2018 10:43:00 Delivery type:  Vaginal, Spontaneous     Baby Feeding: Breast Disposition:home with mother   02/09/2018 Raelyn Moraolitta Burnett Lieber, CNM

## 2018-02-09 NOTE — Lactation Note (Signed)
This note was copied from a baby's chart. Lactation Consultation Note:  Mother was teary when I arrived in her room. FOB left room when I entered the room. Mother reports that FOB is refusing to allow her to fill out a birth certificate for the baby.  Mother reports that FOB felt that he was forced to sign birth certificate with last child and he was concerned about a consperecy theory.   Mother reports that she is having some nipple pain with the latch.   Mother changed infants wet and dirty diaper. Infant had a large black Mec. Diaper.   Observed that mother has small scabs on both tips of nipples.   Assist mother with latching infant on the Left breast in football hold.  Several attempts to get infant to open his mouth wide.  Adjust infants lower jaw for wider gape.  Infant latched well with good burst of rhythmic suckling and audible swallows.  Infant sustained latch for 15 mins.    Mother was given comfort gels and advised to continue to hand express colostrum frequently.   Discussed cluster feeding.  Discussed treatment and prevention of engorgement.  Mother was given a harmony hand pump with instructions to use as needed.  Mother to follow up with [facility] services and community support at needed.    Patient Name: Boy Whitny Semrad [id number] Date: 02/09/2018 Reason for consult: Follow-up assessment   Maternal Data    Feeding Feeding Type: Breast Fed  LATCH Score Latch: Grasps breast easily, tongue down, lips flanged, rhythmical sucking.  Audible Swallowing: Spontaneous and intermittent  Type of Nipple: Everted at rest and after stimulation  Comfort (Breast/Nipple): Filling, red/small blisters or bruises, mild/mod discomfort  Hold (Positioning): Assistance needed to correctly position infant at breast and maintain latch.  LATCH Score: 8  Interventions    Lactation Tools Discussed/Used     Consult Status Consult Status: Follow-up Date: 02/10/18 Follow-up  type: In-patient    Stevan Born [facility] 02/09/2018, 1:41 PM

## 2018-02-10 ENCOUNTER — Encounter: Payer: Medicaid Other | Admitting: Certified Nurse Midwife

## 2018-03-25 ENCOUNTER — Other Ambulatory Visit: Payer: Self-pay

## 2018-03-25 ENCOUNTER — Ambulatory Visit (INDEPENDENT_AMBULATORY_CARE_PROVIDER_SITE_OTHER): Payer: Medicaid Other | Admitting: Obstetrics and Gynecology

## 2018-03-25 ENCOUNTER — Other Ambulatory Visit (HOSPITAL_COMMUNITY)
Admission: RE | Admit: 2018-03-25 | Discharge: 2018-03-25 | Disposition: A | Discharge status: Home / Self Care | Payer: Medicaid Other | Source: Ambulatory Visit | Attending: Obstetrics and Gynecology | Admitting: Obstetrics and Gynecology

## 2018-03-25 ENCOUNTER — Encounter: Payer: Self-pay | Admitting: Obstetrics and Gynecology

## 2018-03-25 DIAGNOSIS — Z1389 Encounter for screening for other disorder: Secondary | ICD-10-CM

## 2018-03-25 DIAGNOSIS — Z3202 Encounter for pregnancy test, result negative: Secondary | ICD-10-CM

## 2018-03-25 DIAGNOSIS — N898 Other specified noninflammatory disorders of vagina: Secondary | ICD-10-CM | POA: Diagnosis present

## 2018-03-25 DIAGNOSIS — Z30013 Encounter for initial prescription of injectable contraceptive: Secondary | ICD-10-CM | POA: Diagnosis not present

## 2018-03-25 DIAGNOSIS — Z309 Encounter for contraceptive management, unspecified: Secondary | ICD-10-CM | POA: Insufficient documentation

## 2018-03-25 LAB — POCT URINE PREGNANCY: Preg Test, Ur: NEGATIVE

## 2018-03-25 MED ORDER — MEDROXYPROGESTERONE ACETATE 150 MG/ML IM SUSP
150.0000 mg | Freq: Once | INTRAMUSCULAR | Status: AC
  Administered 2018-03-25: 150 mg via INTRAMUSCULAR

## 2018-03-25 MED ORDER — MEDROXYPROGESTERONE ACETATE 150 MG/ML IM SUSP: 150.0000 mg | INTRAMUSCULAR | 3 refills | Status: DC

## 2018-03-25 NOTE — Progress Notes (Signed)
UPT today is NEGATIVE.  DEPO given in RD, tolerated well. Next DEPO 05/08-22/2020  Administrations This Visit    medroxyPROGESTERone (DEPO-PROVERA) injection 150 mg    Admin Date 03/25/2018 Action Given Dose 150 mg Route Intramuscular Administered By Maretta Bees, RMA

## 2018-03-25 NOTE — Progress Notes (Signed)
Subjective:     Shannon Holland is a 23 y.o. female who presents for a postpartum visit. She is 6 weeks postpartum following a spontaneous vaginal delivery. I have fully reviewed the prenatal and intrapartum course. The delivery was at 39.5 gestational weeks. Outcome: spontaneous vaginal delivery. Anesthesia: epidural. Postpartum course has been Unremarkable. Baby's course has been Unremarkable Baby is feeding by breast. Bleeding no bleeding. Bowel function is normal. Bladder function is normal. Patient is not sexually active. Contraception method is none. Postpartum depression screening: negative.  The following portions of the patient's history were reviewed and updated as appropriate: allergies, current medications, past family history, past medical history, past social history, past surgical history and problem list.  Review of Systems Pertinent items noted in HPI and remainder of comprehensive ROS otherwise negative.   Objective:    BP 95/60   Pulse 78   Ht 5\' 6"  (1.676 m)   Wt 134 lb 9.6 oz (61.1 kg)   LMP 05/05/2017   Breastfeeding Yes   BMI 21.73 kg/m   General:  alert   Breasts:  Not evaluated  Lungs: clear to auscultation bilaterally  Heart:  regular rate and rhythm, S1, S2 normal, no murmur, click, rub or gallop  Abdomen: soft, non-tender; bowel sounds normal; no masses,  no organomegaly   Vulva:  not evaluated  Vagina: not evaluated  Cervix:  not evaluated  Corpus: not examined  Adnexa:  not evaluated  Rectal Exam: Not performed.        Assessment:     NL postpartum exam.    Contraception management Vaginal discharge  Plan:    1. Contraception: Depo-Provera injections 2. Return to nl ADL's 3. Follow up in: 1 yr or as needed. q 12 weeks for Depo Provera

## 2018-03-25 NOTE — Patient Instructions (Signed)

## 2018-03-26 LAB — CERVICOVAGINAL ANCILLARY ONLY
Bacterial vaginitis: POSITIVE — AB
CHLAMYDIA, DNA PROBE: NEGATIVE
Candida vaginitis: NEGATIVE
Neisseria Gonorrhea: NEGATIVE
Trichomonas: NEGATIVE

## 2018-04-01 ENCOUNTER — Telehealth: Payer: Self-pay

## 2018-04-01 DIAGNOSIS — B9689 Other specified bacterial agents as the cause of diseases classified elsewhere: Secondary | ICD-10-CM

## 2018-04-01 DIAGNOSIS — N760 Acute vaginitis: Principal | ICD-10-CM

## 2018-04-01 MED ORDER — METRONIDAZOLE 1 % EX GEL: CUTANEOUS | 0 refills | Status: AC

## 2018-04-01 NOTE — Telephone Encounter (Signed)
Per provider: Flagyl 500 mg bid x 7 days for BV Thanks [employer] patient  - advised of lab results; Switching Abx from Flagyl to Metrogel 1 applicator vaginally every night for 5 nights -  Due to her nursing - sent to pharmacy. Patient stated she understood and had no further questions.

## 2018-04-05 NOTE — Progress Notes (Signed)
Pt requested return note for work.

## 2018-04-21 ENCOUNTER — Encounter (HOSPITAL_COMMUNITY): Payer: Self-pay | Admitting: Emergency Medicine

## 2018-04-21 ENCOUNTER — Emergency Department (HOSPITAL_COMMUNITY)
Admission: EM | Admit: 2018-04-21 | Discharge: 2018-04-21 | Disposition: A | Discharge status: Home / Self Care | Payer: Medicaid Other | Attending: Emergency Medicine | Admitting: Emergency Medicine

## 2018-04-21 ENCOUNTER — Other Ambulatory Visit: Payer: Self-pay

## 2018-04-21 ENCOUNTER — Emergency Department (HOSPITAL_COMMUNITY): Payer: Medicaid Other

## 2018-04-21 DIAGNOSIS — Z79899 Other long term (current) drug therapy: Secondary | ICD-10-CM | POA: Insufficient documentation

## 2018-04-21 DIAGNOSIS — R1110 Vomiting, unspecified: Secondary | ICD-10-CM | POA: Diagnosis present

## 2018-04-21 DIAGNOSIS — B9789 Other viral agents as the cause of diseases classified elsewhere: Secondary | ICD-10-CM | POA: Insufficient documentation

## 2018-04-21 DIAGNOSIS — J069 Acute upper respiratory infection, unspecified: Secondary | ICD-10-CM | POA: Insufficient documentation

## 2018-04-21 DIAGNOSIS — R197 Diarrhea, unspecified: Secondary | ICD-10-CM | POA: Insufficient documentation

## 2018-04-21 LAB — CBC
HCT: 42.2 % (ref 36.0–46.0)
Hemoglobin: 13.6 g/dL (ref 12.0–15.0)
MCH: 27.4 pg (ref 26.0–34.0)
MCHC: 32.2 g/dL (ref 30.0–36.0)
MCV: 84.9 fL (ref 80.0–100.0)
NRBC: 0 % (ref 0.0–0.2)
PLATELETS: 266 10*3/uL (ref 150–400)
RBC: 4.97 MIL/uL (ref 3.87–5.11)
RDW: 12.9 % (ref 11.5–15.5)
WBC: 4.9 10*3/uL (ref 4.0–10.5)

## 2018-04-21 LAB — COMPREHENSIVE METABOLIC PANEL WITH GFR
ALT: 27 U/L (ref 0–44)
AST: 29 U/L (ref 15–41)
Albumin: 4.7 g/dL (ref 3.5–5.0)
Alkaline Phosphatase: 68 U/L (ref 38–126)
Anion gap: 9 (ref 5–15)
BUN: 10 mg/dL (ref 6–20)
CO2: 24 mmol/L (ref 22–32)
Calcium: 9.8 mg/dL (ref 8.9–10.3)
Chloride: 105 mmol/L (ref 98–111)
Creatinine, Ser: 0.78 mg/dL (ref 0.44–1.00)
GFR calc Af Amer: >60 mL/min
GFR calc non Af Amer: >60 mL/min
Glucose, Bld: 82 mg/dL (ref 70–99)
Potassium: 3.9 mmol/L (ref 3.5–5.1)
Sodium: 138 mmol/L (ref 135–145)
Total Bilirubin: 0.5 mg/dL (ref 0.3–1.2)
Total Protein: 9 g/dL — ABNORMAL HIGH (ref 6.5–8.1)

## 2018-04-21 LAB — URINALYSIS, ROUTINE W REFLEX MICROSCOPIC
Bilirubin Urine: NEGATIVE
Glucose, UA: NEGATIVE mg/dL
Hgb urine dipstick: NEGATIVE
Ketones, ur: 20 mg/dL — AB
Nitrite: NEGATIVE
Protein, ur: NEGATIVE mg/dL
Specific Gravity, Urine: 1.028 (ref 1.005–1.030)
pH: 5 (ref 5.0–8.0)

## 2018-04-21 LAB — I-STAT BETA HCG BLOOD, ED (MC, WL, AP ONLY): I-stat hCG, quantitative: <5 m[IU]/mL

## 2018-04-21 LAB — LIPASE, BLOOD: Lipase: 38 U/L (ref 11–51)

## 2018-04-21 MED ORDER — LIDOCAINE VISCOUS HCL 2 % MT SOLN
15.0000 mL | Freq: Once | OROMUCOSAL | Status: AC
  Administered 2018-04-21: 15 mL via ORAL
  Filled 2018-04-21: qty 15

## 2018-04-21 MED ORDER — SODIUM CHLORIDE 0.9 % IV BOLUS
1000.0000 mL | Freq: Once | INTRAVENOUS | Status: AC
  Administered 2018-04-21: 1000 mL via INTRAVENOUS

## 2018-04-21 MED ORDER — ONDANSETRON 4 MG PO TBDP: ORAL_TABLET | ORAL | 0 refills | Status: AC

## 2018-04-21 MED ORDER — SODIUM CHLORIDE 0.9% FLUSH
3.0000 mL | Freq: Once | INTRAVENOUS | Status: AC
  Administered 2018-04-21: 3 mL via INTRAVENOUS

## 2018-04-21 MED ORDER — KETOROLAC TROMETHAMINE 30 MG/ML IJ SOLN
30.0000 mg | Freq: Once | INTRAMUSCULAR | Status: AC
  Administered 2018-04-21: 30 mg via INTRAVENOUS
  Filled 2018-04-21: qty 1

## 2018-04-21 MED ORDER — ALUM & MAG HYDROXIDE-SIMETH 200-200-20 MG/5ML PO SUSP
30.0000 mL | Freq: Once | ORAL | Status: AC
  Administered 2018-04-21: 30 mL via ORAL
  Filled 2018-04-21: qty 30

## 2018-04-21 MED ORDER — ONDANSETRON HCL 4 MG/2ML IJ SOLN
4.0000 mg | Freq: Once | INTRAMUSCULAR | Status: AC
  Administered 2018-04-21: 4 mg via INTRAVENOUS
  Filled 2018-04-21: qty 2

## 2018-04-21 NOTE — ED Notes (Signed)
Pt tolerated PO challenge

## 2018-04-21 NOTE — ED Notes (Signed)
Pt escorted to bathroom and urine sample collected and sent. Pt resting back in bed with some relief from medications. No acute distress.

## 2018-04-21 NOTE — ED Triage Notes (Signed)
Patient c/o non productive cough with body aches, nausea, and diarrhea x2 days.

## 2018-04-21 NOTE — ED Provider Notes (Signed)
[facility]-EMERGENCY DEPT Provider Note   CSN: [id number] Arrival date & time: 04/21/18  1635    History   Chief Complaint Chief Complaint  Patient presents with  . Cough  . Diarrhea    HPI Shannon Holland is a 23 y.o. female here presenting with vomiting, diarrhea, cough.  Patient states that for the last 2 days, she has been having body aches and nausea and diarrhea.  Patient denies any sick contacts and denies any recent travel.  Patient denies any chest pain or shortness of breath.       The history is provided by the patient.    Past Medical History:  Diagnosis Date  . Anemia     Patient Active Problem List   Diagnosis Date Noted  . Postpartum care following vaginal delivery 03/25/2018  . Contraception management 03/25/2018  . Vaginal discharge 03/25/2018    Past Surgical History:  Procedure Laterality Date  . MOUTH SURGERY       OB History    Gravida  2   Para  2   Term  2   Preterm      AB      Living  2     SAB      TAB      Ectopic      Multiple  0   Live Births  2            Home Medications    Prior to Admission medications   Medication Sig Start Date End Date Taking? Authorizing Provider  Prenatal Vit-Fe Fumarate-FA (PRENATAL MULTIVITAMIN) TABS tablet Take 1 tablet by mouth daily at 12 noon.    Yes [provider]  ibuprofen (ADVIL,MOTRIN) 600 MG tablet Take 1 tablet (600 mg total) by mouth every 6 (six) hours. Patient not taking: Reported on 04/21/2018 02/09/18   Raelyn Mora, CNM  medroxyPROGESTERone (DEPO-PROVERA) 150 MG/ML injection Inject 1 mL (150 mg total) into the muscle every 3 (three) months. 03/25/18   Hermina Staggers, MD  metroNIDAZOLE (METROGEL) 1 % gel 1 applicator vaginally every night for 5 nights. Patient not taking: Reported on 04/21/2018 04/01/18   Currie Paris, NP    Family History Family History  Problem Relation Age of Onset  . Hypertension Mother     Social  History Social History   Tobacco Use  . Smoking status: Never Smoker  . Smokeless tobacco: Never Used  Substance Use Topics  . Alcohol use: No  . Drug use: No     Allergies   Shrimp [shellfish allergy] and Penicillins   Review of Systems Review of Systems  Respiratory: Positive for cough.   Gastrointestinal: Positive for diarrhea and nausea.  All other systems reviewed and are negative.    Physical Exam Updated Vital Signs BP 114/83 (BP Location: Left Arm)   Pulse 95   Temp 99 F (37.2 C) (Oral)   Resp 15   SpO2 100%   Physical Exam Vitals signs and nursing note reviewed.  HENT:     Head: Normocephalic.     Right Ear: Tympanic membrane normal.     Left Ear: Tympanic membrane normal.     Nose: Nose normal.     Mouth/Throat:     Mouth: Mucous membranes are moist.  Eyes:     Extraocular Movements: Extraocular movements intact.     Pupils: Pupils are equal, round, and reactive to light.  Neck:     Musculoskeletal: Normal range of motion.  Cardiovascular:     Rate and Rhythm: Normal rate.     Pulses: Normal pulses.  Pulmonary:     Effort: Pulmonary effort is normal.  Abdominal:     General: Abdomen is flat.     Palpations: Abdomen is soft.  Musculoskeletal: Normal range of motion.  Skin:    General: Skin is warm.     Capillary Refill: Capillary refill takes less than 2 seconds.  Neurological:     General: No focal deficit present.     Mental Status: She is alert and oriented to person, place, and time.  Psychiatric:        Mood and Affect: Mood normal.        Behavior: Behavior normal.      ED Treatments / Results  Labs (all labs ordered are listed, but only abnormal results are displayed) Labs Reviewed  COMPREHENSIVE METABOLIC PANEL - Abnormal; Notable for the following components:      Result Value   Total Protein 9.0 (*)    All other components within normal limits  URINALYSIS, ROUTINE W REFLEX MICROSCOPIC - Abnormal; Notable for the  following components:   APPearance HAZY (*)    Ketones, ur 20 (*)    Leukocytes,Ua SMALL (*)    Bacteria, UA RARE (*)    All other components within normal limits  LIPASE, BLOOD  CBC  I-STAT BETA HCG BLOOD, ED ([facility], [facility], [facility] ONLY)    EKG None  Radiology Dg Chest 2 View  Result Date: 04/21/2018 CLINICAL DATA:  Nonproductive cough and myalgias with nausea and diarrhea for the past 2 days. EXAM: CHEST - 2 VIEW COMPARISON:  None. FINDINGS: Normal sized heart. Clear lungs. Minimal peribronchial thickening. Unremarkable bones. IMPRESSION: Minimal bronchitic changes. Electronically Signed   By: Beckie Salts M.D.   On: 04/21/2018 19:44    Procedures Procedures (including critical care time)  Medications Ordered in ED Medications  alum & mag hydroxide-simeth (MAALOX/MYLANTA) 200-200-20 MG/5ML suspension 30 mL (has no administration in time range)    And  lidocaine (XYLOCAINE) 2 % viscous mouth solution 15 mL (has no administration in time range)  sodium chloride flush (NS) 0.9 % injection 3 mL (3 mLs Intravenous Given 04/21/18 1959)  sodium chloride 0.9 % bolus 1,000 mL (1,000 mLs Intravenous New Bag/Given 04/21/18 1959)  ketorolac (TORADOL) 30 MG/ML injection 30 mg (30 mg Intravenous Given 04/21/18 1958)  ondansetron (ZOFRAN) injection 4 mg (4 mg Intravenous Given 04/21/18 1958)     Initial Impression / Assessment and Plan / ED Course  I have reviewed the triage vital signs and the nursing notes.  Pertinent labs & imaging results that were available during my care of the patient were reviewed by me and considered in my medical decision making (see chart for details).       Shannon Holland is a 23 y.o. female here with nausea, diarrhea. I think likely viral syndrome. She has no travel and is afebrile and I doubt flu or coronavirus. Will check labs, hydrate and reassess.   9:16 PM Labs unremarkable. UA unremarkable. CXR showed no pneumonia. Likely viral gastro. Will dc home with  zofran prn.    Final Clinical Impressions(s) / ED Diagnoses   Final diagnoses:  None    ED Discharge Orders    None       Charlynne Pander, MD 04/21/18 2118

## 2018-04-21 NOTE — Discharge Instructions (Addendum)
Take zofran for nausea.   Stay hydrated.   Rest for 2 days.   See your doctor  Return to ER if you have worse vomiting, dehydration, severe abdominal pain.

## 2018-06-16 ENCOUNTER — Ambulatory Visit (INDEPENDENT_AMBULATORY_CARE_PROVIDER_SITE_OTHER): Payer: Medicaid Other

## 2018-06-16 ENCOUNTER — Other Ambulatory Visit: Payer: Self-pay

## 2018-06-16 DIAGNOSIS — Z3042 Encounter for surveillance of injectable contraceptive: Secondary | ICD-10-CM

## 2018-06-16 MED ORDER — MEDROXYPROGESTERONE ACETATE 150 MG/ML IM SUSP
150.0000 mg | INTRAMUSCULAR | Status: DC
  Administered 2018-06-16 – 2019-07-26 (×3): 150 mg via INTRAMUSCULAR

## 2018-06-16 NOTE — Progress Notes (Signed)
Pt is in the office for depo, administered in L Del and pt tolerated well. Next due 7/29-8/12 .Marland Kitchen Administrations This Visit    medroxyPROGESTERone (DEPO-PROVERA) injection 150 mg    Admin Date 06/16/2018 Action Given Dose 150 mg Route Intramuscular Administered By Katrina Stack, RN

## 2018-06-24 NOTE — Progress Notes (Signed)
Patient ID: Shannon Holland, female   DOB: 07-21-1995, 23 y.o.   MRN: [id number] I have reviewed the chart and agree with nursing staff's documentation of this patient's encounter.  [name], MD 06/24/2018 1:05 PM

## 2018-06-24 NOTE — Progress Notes (Signed)
Patient ID: Shannon Holland, female   DOB: 06-07-1995, 23 y.o.   MRN: [id number] I have reviewed the chart and agree with nursing staff's documentation of this patient's encounter.  [name], MD 06/24/2018 1:01 PM

## 2018-09-03 ENCOUNTER — Other Ambulatory Visit: Payer: Self-pay

## 2018-09-03 ENCOUNTER — Ambulatory Visit (INDEPENDENT_AMBULATORY_CARE_PROVIDER_SITE_OTHER): Payer: Medicaid Other

## 2018-09-03 DIAGNOSIS — Z3042 Encounter for surveillance of injectable contraceptive: Secondary | ICD-10-CM

## 2018-09-03 MED ORDER — MEDROXYPROGESTERONE ACETATE 150 MG/ML IM SUSP: 150.0000 mg | INTRAMUSCULAR | 0 refills | Status: DC

## 2018-09-03 NOTE — Progress Notes (Signed)
Patient is in the office for depo, administered in R Del and pt tolerated well. Next due 10/16-10/30. .. Administrations This Visit    medroxyPROGESTERone (DEPO-PROVERA) injection 150 mg    Admin Date 09/03/2018 Action Given Dose 150 mg Route Intramuscular Administered By Hinton Lovely, RN

## 2018-11-26 ENCOUNTER — Other Ambulatory Visit: Payer: Self-pay

## 2018-11-26 ENCOUNTER — Ambulatory Visit (INDEPENDENT_AMBULATORY_CARE_PROVIDER_SITE_OTHER): Payer: Medicaid Other

## 2018-11-26 DIAGNOSIS — Z3042 Encounter for surveillance of injectable contraceptive: Secondary | ICD-10-CM

## 2018-11-26 MED ORDER — MEDROXYPROGESTERONE ACETATE 150 MG/ML IM SUSP: 150.0000 mg | INTRAMUSCULAR | 1 refills | Status: DC

## 2018-11-26 MED ORDER — MEDROXYPROGESTERONE ACETATE 150 MG/ML IM SUSP
150.0000 mg | Freq: Once | INTRAMUSCULAR | Status: AC
  Administered 2018-11-26: 150 mg via INTRAMUSCULAR

## 2018-11-26 NOTE — Progress Notes (Signed)
Pt presents to the clinic for a depo injection. Pt tolerated injection well in the LD without complications. Pt advised to make next appointment between Jan. 8-22. -EH/RMA

## 2019-02-15 ENCOUNTER — Ambulatory Visit (INDEPENDENT_AMBULATORY_CARE_PROVIDER_SITE_OTHER): Payer: Medicaid Other

## 2019-02-15 ENCOUNTER — Other Ambulatory Visit: Payer: Self-pay

## 2019-02-15 VITALS — BP 109/68 | HR 78 | Ht 66.0 in | Wt 119.0 lb

## 2019-02-15 DIAGNOSIS — Z3042 Encounter for surveillance of injectable contraceptive: Secondary | ICD-10-CM | POA: Diagnosis not present

## 2019-02-15 MED ORDER — MEDROXYPROGESTERONE ACETATE 150 MG/ML IM SUSP
150.0000 mg | Freq: Once | INTRAMUSCULAR | Status: AC
  Administered 2019-02-15: 14:00:00 150 mg via INTRAMUSCULAR

## 2019-02-15 NOTE — Progress Notes (Addendum)
GYN presents for DEPO, given in RD, tolerated well.  Next DEPO March 30 - May 17, 2019  Administrations This Visit    medroxyPROGESTERone (DEPO-PROVERA) injection 150 mg    Admin Date 02/15/2019 Action Given Dose 150 mg Route Intramuscular Administered By Maretta Bees, RMA

## 2019-05-05 ENCOUNTER — Other Ambulatory Visit: Payer: Self-pay

## 2019-05-05 ENCOUNTER — Ambulatory Visit (INDEPENDENT_AMBULATORY_CARE_PROVIDER_SITE_OTHER): Payer: Medicaid Other

## 2019-05-05 VITALS — BP 97/64 | HR 74 | Ht 66.0 in | Wt 116.0 lb

## 2019-05-05 DIAGNOSIS — Z3042 Encounter for surveillance of injectable contraceptive: Secondary | ICD-10-CM

## 2019-05-05 MED ORDER — MEDROXYPROGESTERONE ACETATE 150 MG/ML IM SUSP
150.0000 mg | Freq: Once | INTRAMUSCULAR | Status: AC
  Administered 2019-05-05: 150 mg via INTRAMUSCULAR

## 2019-05-05 NOTE — Progress Notes (Signed)
Presents for DEPO Injection, given in LD, tolerated well. Needs Annual Exam before next Depo.  Next DEPO June 17-August 04, 2019  Administrations This Visit    medroxyPROGESTERone (DEPO-PROVERA) injection 150 mg    Admin Date 05/05/2019 Action Given Dose 150 mg Route Intramuscular Administered By Maretta Bees, RMA

## 2019-06-08 ENCOUNTER — Ambulatory Visit: Payer: Medicaid Other | Admitting: Certified Nurse Midwife

## 2019-06-08 ENCOUNTER — Encounter: Payer: Self-pay | Admitting: Certified Nurse Midwife

## 2019-06-08 ENCOUNTER — Other Ambulatory Visit: Payer: Self-pay

## 2019-06-08 VITALS — BP 99/66 | HR 68 | Wt 106.9 lb

## 2019-06-08 DIAGNOSIS — F320 Major depressive disorder, single episode, mild: Secondary | ICD-10-CM | POA: Diagnosis not present

## 2019-06-08 DIAGNOSIS — Z0000 Encounter for general adult medical examination without abnormal findings: Secondary | ICD-10-CM | POA: Diagnosis not present

## 2019-06-08 DIAGNOSIS — Z3042 Encounter for surveillance of injectable contraceptive: Secondary | ICD-10-CM

## 2019-06-08 MED ORDER — MEDROXYPROGESTERONE ACETATE 150 MG/ML IM SUSP: 150.0000 mg | INTRAMUSCULAR | 3 refills | Status: AC

## 2019-06-08 NOTE — Progress Notes (Signed)
GYNECOLOGY ANNUAL PREVENTATIVE CARE ENCOUNTER NOTE  History:     Shannon Holland is a 24 y.o. [phone] female here for a routine annual gynecologic exam.  Current complaints: new onset of depression.  Denies abnormal vaginal bleeding, discharge, pelvic pain, problems with intercourse or other gynecologic concerns.    Gynecologic History Patient's last menstrual period was 05/25/2019 (approximate). Contraception: Depo-Provera injections Last Pap: 07/2017. Results were: normal with negative HPV    Obstetric History OB History  Gravida Para Term Preterm AB Living  2 2 2     2   SAB TAB Ectopic Multiple Live Births        0 2    # Outcome Date GA Lbr Len/2nd Weight Sex Delivery Anes PTL Lv  2 Term 02/07/18 [redacted]w[redacted]d 04:25 / 01:48 7 lb 11.1 oz (3.49 kg) M Vag-Spont EPI  LIV  1 Term 05/20/15 [redacted]w[redacted]d 03:06 / 00:56 6 lb 8.8 oz (2.971 kg) M Vag-Spont None  LIV     Birth Comments: none    Past Medical History:  Diagnosis Date   Anemia     Past Surgical History:  Procedure Laterality Date   MOUTH SURGERY      Current Outpatient Medications on File Prior to Visit  Medication Sig Dispense Refill   ibuprofen (ADVIL,MOTRIN) 600 MG tablet Take 1 tablet (600 mg total) by mouth every 6 (six) hours. (Patient not taking: Reported on 04/21/2018) 30 tablet 0   metroNIDAZOLE (METROGEL) 1 % gel 1 applicator vaginally every night for 5 nights. (Patient not taking: Reported on 04/21/2018) 60 g 0   ondansetron (ZOFRAN ODT) 4 MG disintegrating tablet 4mg  ODT q4 hours prn nausea/vomit (Patient not taking: Reported on 06/16/2018) 10 tablet 0   Prenatal Vit-Fe Fumarate-FA (PRENATAL MULTIVITAMIN) TABS tablet Take 1 tablet by mouth daily at 12 noon.      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     Social History:  reports that she has never smoked. She has never used smokeless tobacco. She reports that she does not drink alcohol or use drugs.  Family History  Problem Relation Age of Onset   Hypertension Mother     The following portions of the patient's history were reviewed and updated as appropriate: allergies, current medications, past family history, past medical history, past social history, past surgical history and problem list.  Review of Systems Pertinent items noted in HPI and remainder of comprehensive ROS otherwise negative.  Physical Exam:  BP 99/66    Pulse 68    Wt 106 lb 14.4 oz (48.5 kg)    LMP 05/25/2019 (Approximate)    Breastfeeding No    BMI 17.25 kg/m  CONSTITUTIONAL: Well-developed, well-nourished female in no acute distress.  HENT:  Normocephalic, atraumatic, External right  and left ear normal. Oropharynx is clear and moist EYES: Conjunctivae and EOM are normal. Pupils are equal, round, and reactive to light.  NECK: Normal range of motion, supple, no masses.  Normal thyroid.  SKIN: Skin is warm and dry. No rash noted. Not diaphoretic. No erythema. No pallor. MUSCULOSKELETAL: Normal range of motion. No tenderness.  No cyanosis, clubbing, or edema.  2+ distal pulses. NEUROLOGIC: Alert and oriented to person, place, and time. Normal reflexes, muscle tone coordination.  PSYCHIATRIC: Normal mood and  affect. Normal behavior. Normal judgment and thought content. CARDIOVASCULAR: Normal heart rate noted, regular rhythm RESPIRATORY: Clear to auscultation bilaterally. Effort and breath sounds normal, no problems with respiration noted. BREASTS: Symmetric in size. No masses, tenderness, skin changes, nipple drainage, or lymphadenopathy bilaterally. Performed in the presence of a chaperone. ABDOMEN: Soft, no distention noted.  No tenderness, rebound or guarding.    Assessment and Plan:    1. Encounter for annual physical examination excluding gynecological examination in a patient older than 17 years - Normal annual examination  - pap not needed until 2022  2. Surveillance for Depo-Provera contraception - Patient denies complaints or concerns  - Refill sent to pharmacy of choice  - medroxyPROGESTERone (DEPO-PROVERA) 150 MG/ML injection; Inject 1 mL (150 mg total) into the muscle every 3 (three) months.  Dispense: 1 mL; Refill: 3  3. Current mild episode of major depressive disorder without prior episode [facility]) - Patient reports new onset of stress, anxiety and depression  - patient reports FOB parents taking kids away from her and will not give them back. Patient repots she has tried to get police involved but they were not able to help because there is no "custody agreement in place".  - Encouraged patient to talk to [facility] specialist but also SW Seth Bake to be able to assist  - patient reports she is trying to obtain a lawyer to be able to get kids back in household  - Ambulatory referral to [place]   Follow up as scheduled for Depo injections  Referred to integrated behavioral health  Routine preventative health maintenance measures emphasized. Please refer to After Visit Summary for other counseling recommendations.    Lajean Manes, [place] for [employer], Secretary

## 2019-06-08 NOTE — Patient Instructions (Signed)
Medroxyprogesterone injection [Contraceptive] What is this medicine? MEDROXYPROGESTERONE (me DROX ee proe JES te rone) contraceptive injections prevent pregnancy. They provide effective birth control for 3 months. Depo-subQ Provera 104 is also used for treating pain related to endometriosis. This medicine may be used for other purposes; ask your health care provider or pharmacist if you have questions. COMMON BRAND NAME(S): Depo-Provera, Depo-subQ Provera 104 What should I tell my health care provider before I take this medicine? They need to know if you have any of these conditions:  frequently drink alcohol  asthma  blood vessel disease or a history of a blood clot in the lungs or legs  bone disease such as osteoporosis  breast cancer  diabetes  eating disorder (anorexia nervosa or bulimia)  high blood pressure  HIV infection or AIDS  kidney disease  liver disease  mental depression  migraine  seizures (convulsions)  stroke  tobacco smoker  vaginal bleeding  an unusual or allergic reaction to medroxyprogesterone, other hormones, medicines, foods, dyes, or preservatives  pregnant or trying to get pregnant  breast-feeding How should I use this medicine? Depo-Provera Contraceptive injection is given into a muscle. Depo-subQ Provera 104 injection is given under the skin. These injections are given by a health care professional. You must not be pregnant before getting an injection. The injection is usually given during the first 5 days after the start of a menstrual period or 6 weeks after delivery of a baby. Talk to your pediatrician regarding the use of this medicine in children. Special care may be needed. These injections have been used in female children who have started having menstrual periods. Overdosage: If you think you have taken too much of this medicine contact a poison control center or emergency room at once. NOTE: This medicine is only for you. Do not  share this medicine with others. What if I miss a dose? Try not to miss a dose. You must get an injection once every 3 months to maintain birth control. If you cannot keep an appointment, call and reschedule it. If you wait longer than 13 weeks between Depo-Provera contraceptive injections or longer than 14 weeks between Depo-subQ Provera 104 injections, you could get pregnant. Use another method for birth control if you miss your appointment. You may also need a pregnancy test before receiving another injection. What may interact with this medicine? Do not take this medicine with any of the following medications:  bosentan This medicine may also interact with the following medications:  aminoglutethimide  antibiotics or medicines for infections, especially rifampin, rifabutin, rifapentine, and griseofulvin  aprepitant  barbiturate medicines such as phenobarbital or primidone  bexarotene  carbamazepine  medicines for seizures like ethotoin, felbamate, oxcarbazepine, phenytoin, topiramate  modafinil  St. John's wort This list may not describe all possible interactions. Give your health care provider a list of all the medicines, herbs, non-prescription drugs, or dietary supplements you use. Also tell them if you smoke, drink alcohol, or use illegal drugs. Some items may interact with your medicine. What should I watch for while using this medicine? This drug does not protect you against HIV infection (AIDS) or other sexually transmitted diseases. Use of this product may cause you to lose calcium from your bones. Loss of calcium may cause weak bones (osteoporosis). Only use this product for more than 2 years if other forms of birth control are not right for you. The longer you use this product for birth control the more likely you will be at risk   for weak bones. Ask your health care professional how you can keep strong bones. You may have a change in bleeding pattern or irregular periods.  Many females stop having periods while taking this drug. If you have received your injections on time, your chance of being pregnant is very low. If you think you may be pregnant, see your health care professional as soon as possible. Tell your health care professional if you want to get pregnant within the next year. The effect of this medicine may last a long time after you get your last injection. What side effects may I notice from receiving this medicine? Side effects that you should report to your doctor or health care professional as soon as possible:  allergic reactions like skin rash, itching or hives, swelling of the face, lips, or tongue  breast tenderness or discharge  breathing problems  changes in vision  depression  feeling faint or lightheaded, falls  fever  pain in the abdomen, chest, groin, or leg  problems with balance, talking, walking  unusually weak or tired  yellowing of the eyes or skin Side effects that usually do not require medical attention (report to your doctor or health care professional if they continue or are bothersome):  acne  fluid retention and swelling  headache  irregular periods, spotting, or absent periods  temporary pain, itching, or skin reaction at site where injected  weight gain This list may not describe all possible side effects. Call your doctor for medical advice about side effects. You may report side effects to FDA at 1-800-FDA-1088. Where should I keep my medicine? This does not apply. The injection will be given to you by a health care professional. NOTE: This sheet is a summary. It may not cover all possible information. If you have questions about this medicine, talk to your doctor, pharmacist, or health care provider.  2020 [employer]vier/Gold Standard (2008-02-11 18:37:56)  

## 2019-06-08 NOTE — Progress Notes (Signed)
Pt is here for annual gyn exam. Last pap 07/23/17, normal. Depo for contraception, next injection due 06/17-07/01. Pt does not desire any STD testing today. Pt reports she needs refill on depo.

## 2019-06-14 ENCOUNTER — Ambulatory Visit: Payer: Medicaid Other | Admitting: Licensed Clinical Social Worker

## 2019-06-14 DIAGNOSIS — Z5329 Procedure and treatment not carried out because of patient's decision for other reasons: Secondary | ICD-10-CM

## 2019-06-14 DIAGNOSIS — Z91199 Patient's noncompliance with other medical treatment and regimen due to unspecified reason: Secondary | ICD-10-CM

## 2019-06-14 NOTE — BH Specialist Note (Signed)
Called patient twice regarding scheduled mychart appt. Unable to leave message. Patient no show appt

## 2019-06-22 ENCOUNTER — Telehealth: Payer: Self-pay

## 2019-06-22 NOTE — Telephone Encounter (Signed)
Returned call, no answer, left vm 

## 2019-07-26 ENCOUNTER — Ambulatory Visit (INDEPENDENT_AMBULATORY_CARE_PROVIDER_SITE_OTHER): Payer: Medicaid Other | Admitting: *Deleted

## 2019-07-26 ENCOUNTER — Other Ambulatory Visit: Payer: Self-pay

## 2019-07-26 DIAGNOSIS — Z3042 Encounter for surveillance of injectable contraceptive: Secondary | ICD-10-CM

## 2019-07-26 NOTE — Progress Notes (Signed)
Pt is in office for Depo injection. Pt is on time for Depo. Injection given, pt tolerated well.  Pt advised to RTO 9/7-9/21/2021 for next Depo.  Pt states she may be interested in a long term method, advised to schedule appt if she chooses.   Pt states she may be moving out of town soon as well.   Pt has no other concerns today.   Administrations This Visit    medroxyPROGESTERone (DEPO-PROVERA) injection 150 mg    Admin Date 07/26/2019 Action Given Dose 150 mg Route Intramuscular Administered By Lanney Gins, CMA

## 2019-09-08 IMAGING — US US MFM OB DETAIL+14 WK
1 series · 13 of 28 positions shown · non-contrast
Comparison: none

[Series 1: us mfm ob detail+14 wk · 13 of 151 slices shown]
[im 6/151]
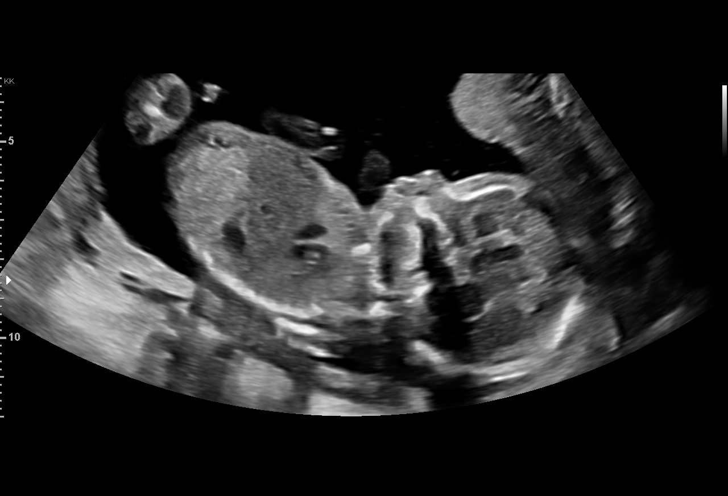
[im 17/151]
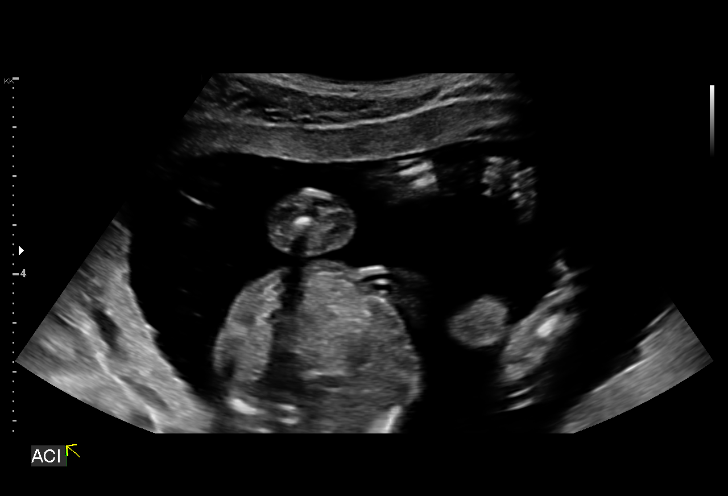
[im 28/151]
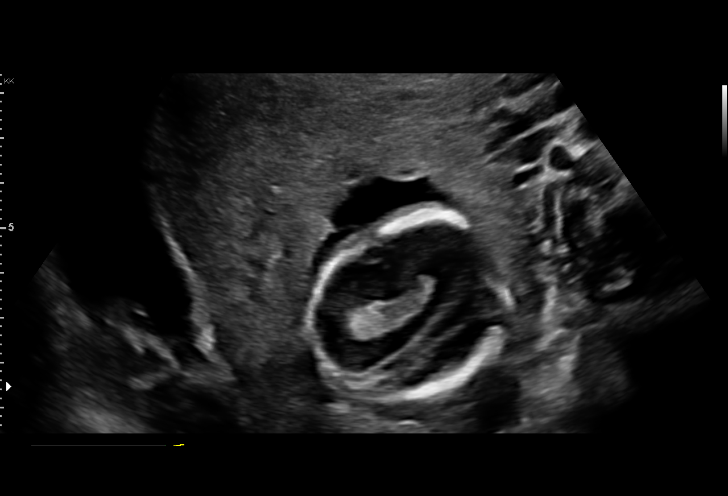
[im 39/151]
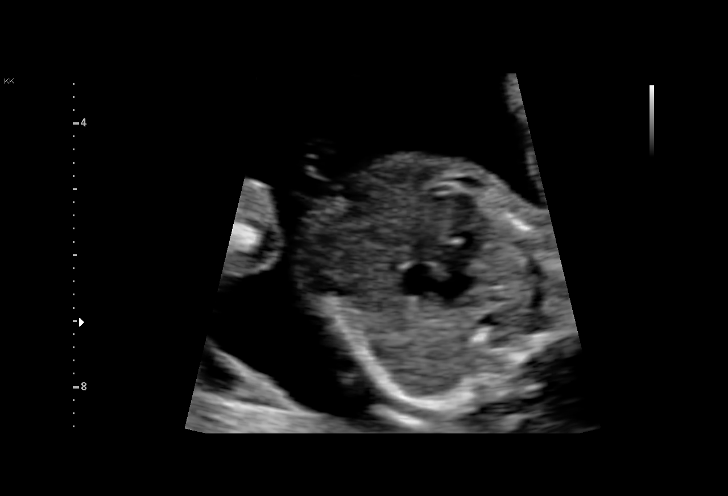
[im 51/151]
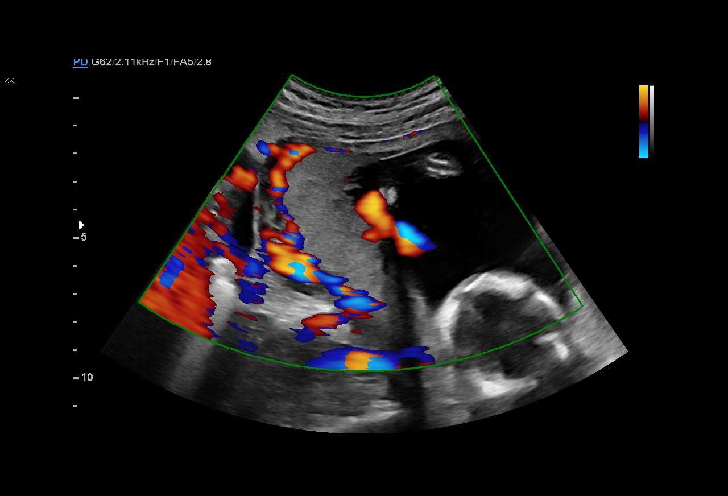
[im 62/151]
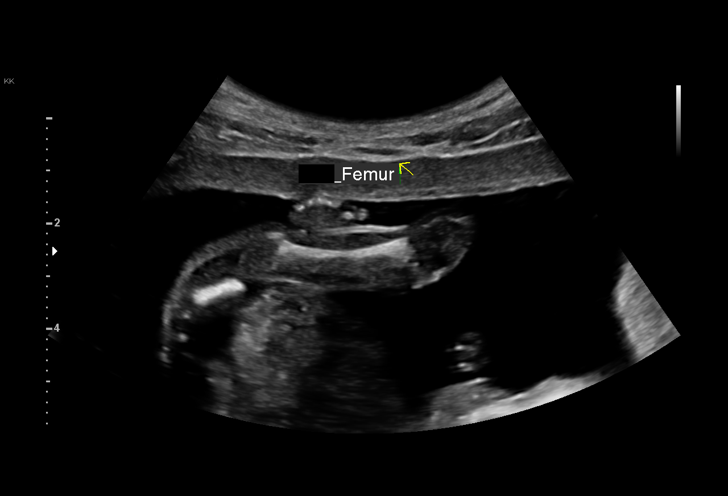
[im 78/151]
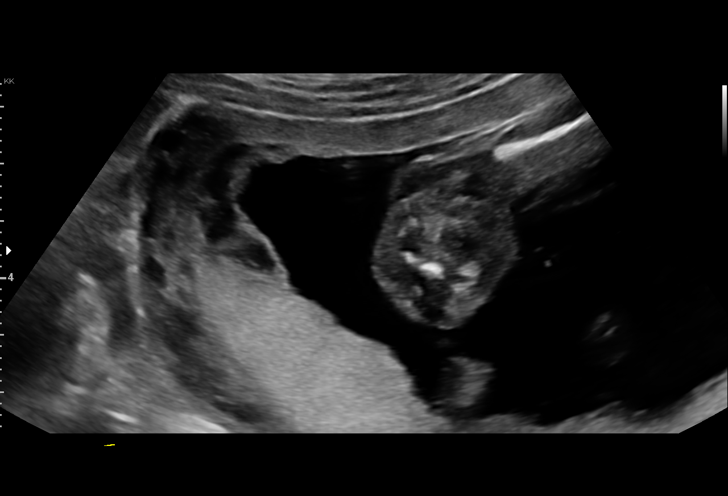
[im 89/151]
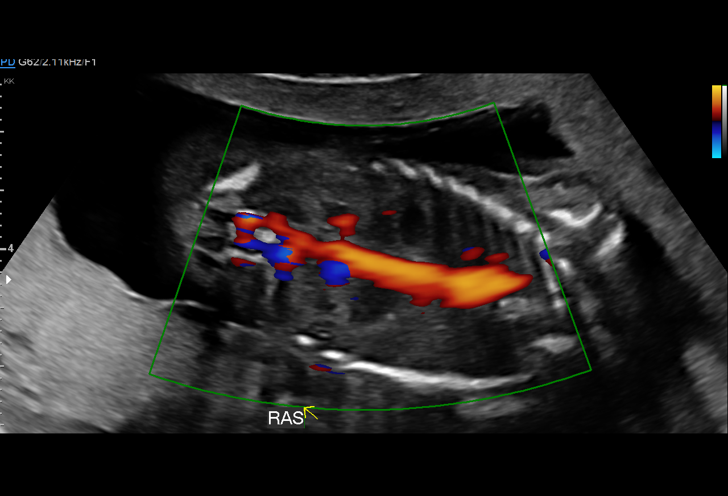
[im 101/151]
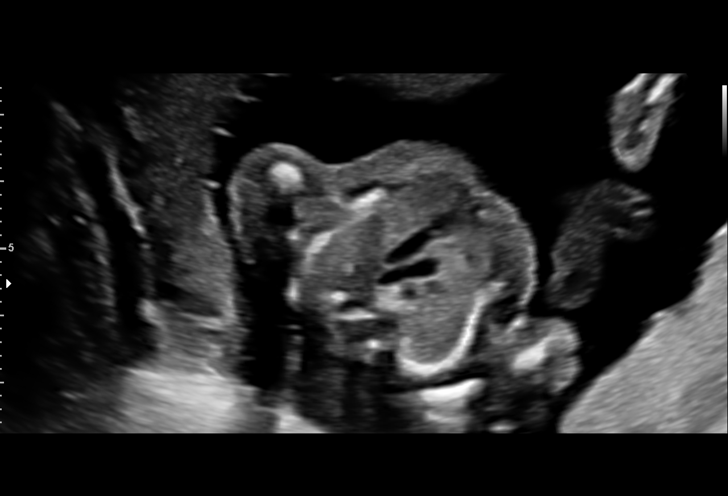
[im 112/151]
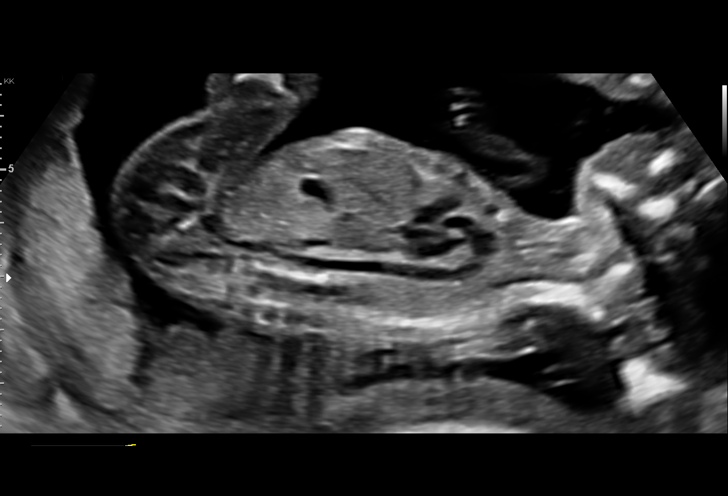
[im 123/151]
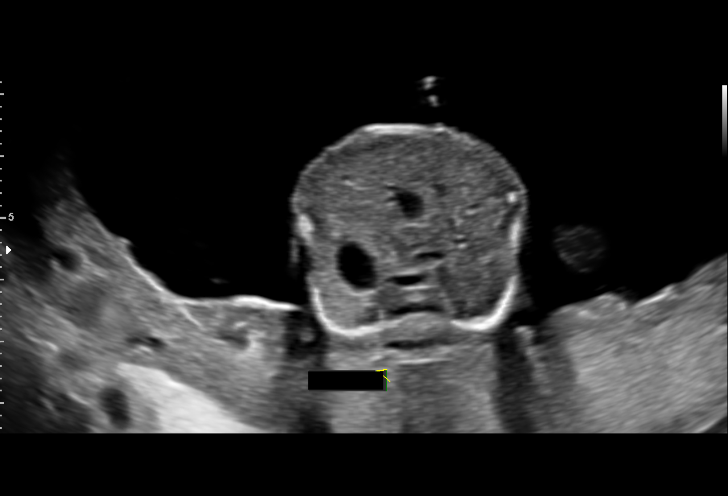
[im 134/151]
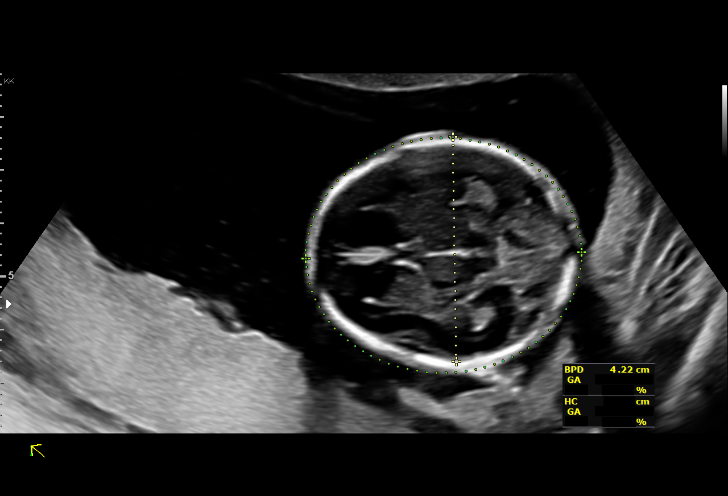
[im 145/151]
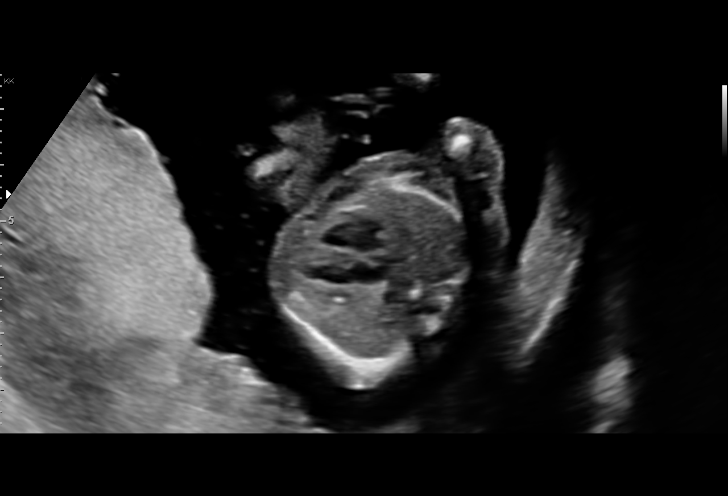

[13 of 28 positions shown; findings below may reference images not displayed]

pm)

Name:       ALEXIDO CHARUMBIRA            Visit Date: 09/07/2017 [DATE]

1  FAYAL CACA              [id number]      [id number]     [id number]
Indications

17 weeks gestation of pregnancy
Encounter for antenatal screening for
malformations
Abnormal biochemical finding on antenatal
screening of mother
OB History

Blood Type:            Height:  5'6"   Weight (lb):  118       BMI:
Gravidity:    2         Term:   1        Prem:   0        SAB:   0
TOP:          0       Ectopic:  0        Living: 1
Fetal Evaluation

Num Of Fetuses:     1
Fetal Heart         153
Rate(bpm):
Cardiac Activity:   Observed
Presentation:       Cephalic
Placenta:           Posterior Fundal
P. Cord Insertion:  Visualized

Amniotic Fluid
AFI FV:      Subjectively within normal limits

Largest Pocket(cm)
4.5
Biometry

BPD:      42.2  mm     G. Age:  18w 5d         86  %    CI:        78.09   %    70 - 86
FL/HC:      16.6   %    15.8 - 18
HC:      151.1  mm     G. Age:  18w 1d         57  %    HC/AC:      1.12        1.07 -
AC:      134.7  mm     G. Age:  19w 0d         81  %    FL/BPD:     59.5   %
FL:       25.1  mm     G. Age:  17w 4d         35  %    FL/AC:      18.6   %    20 - 24
NFT:       3.5  mm

Est. FW:     234  gm      0 lb 8 oz     57  %
Gestational Age

LMP:           17w 6d        Date:  05/05/17                 EDD:   02/09/18
U/S Today:     18w 3d                                        EDD:   02/05/18
Best:          17w 6d     Det. By:  LMP  (05/05/17)          EDD:   02/09/18
Anatomy

Cranium:               Appears normal         Aortic Arch:            Appears normal
Cavum:                 Appears normal         Ductal Arch:            Appears normal
Ventricles:            Appears normal         Diaphragm:              Appears normal
Choroid Plexus:        Appears normal         Stomach:                Appears normal, left
sided
Cerebellum:            Appears normal         Abdomen:                Appears normal
Posterior Fossa:       Appears normal         Abdominal Wall:         Appears nml (cord
insert, abd wall)
Nuchal Fold:           Appears normal         Cord Vessels:           Appears normal (3
vessel cord)
Face:                  Appears normal         Kidneys:                Appear normal
(orbits and profile)
Lips:                  Appears normal         Bladder:                Appears normal
Thoracic:              Appears normal         Spine:                  Not well visualized
Heart:                 Echogenic focus        Upper Extremities:      Appears normal
in LV
RVOT:                  Appears normal         Lower Extremities:      Appears normal
LVOT:                  Appears normal

Other:  Parents do not wish to know sex of fetus. Male gender. Heels and 5th
digit visualized.
Cervix Uterus Adnexa

Cervix
Length:            3.1  cm.
Normal appearance by transabdominal scan.
Impression

We performed fetal anatomy scan. An echogenic intracardiac
focus is seen. No other makers of aneuploidies or fetal
structural defects are seen. Fetal biometry is consistent with
her previously-established dates. Amniotic fluid is normal and
good fetal activity is seen.
I informed the patient that given that she had [employer]ch[employer] for fetal
aneuploidies on cell-free fetal DNA screening, finding of
echogenic intracardiac focus should be considered a normal
variant and that the risk of trisomy 21 is not increased. I also
reassured that echogenic focus does not increase the risk of
cardiac defects.

On cell-free fetal DNA screening, the risks of fetal
aneuploidies are not increased.
Recommendations

An appointment was made for her to return in 6 weeks for
completion of fetal anatomy (spine).

## 2019-10-18 ENCOUNTER — Ambulatory Visit: Payer: Medicaid Other

## 2020-01-04 ENCOUNTER — Telehealth: Payer: Self-pay

## 2020-01-04 DIAGNOSIS — Z3042 Encounter for surveillance of injectable contraceptive: Secondary | ICD-10-CM

## 2020-01-04 MED ORDER — MEDROXYPROGESTERONE ACETATE 150 MG/ML IM SUSP: 150.0000 mg | INTRAMUSCULAR | 0 refills | Status: DC

## 2020-01-04 NOTE — Telephone Encounter (Signed)
Depo refill

## 2020-04-01 ENCOUNTER — Other Ambulatory Visit: Payer: Self-pay | Admitting: Obstetrics

## 2020-04-21 IMAGING — CR CHEST - 2 VIEW
2 series · 2 of 2 positions shown · non-contrast
Comparison: None.

CLINICAL DATA: Nonproductive cough and myalgias with nausea and
diarrhea for the past 2 days.

EXAM:
CHEST - 2 VIEW

[w chest pa]
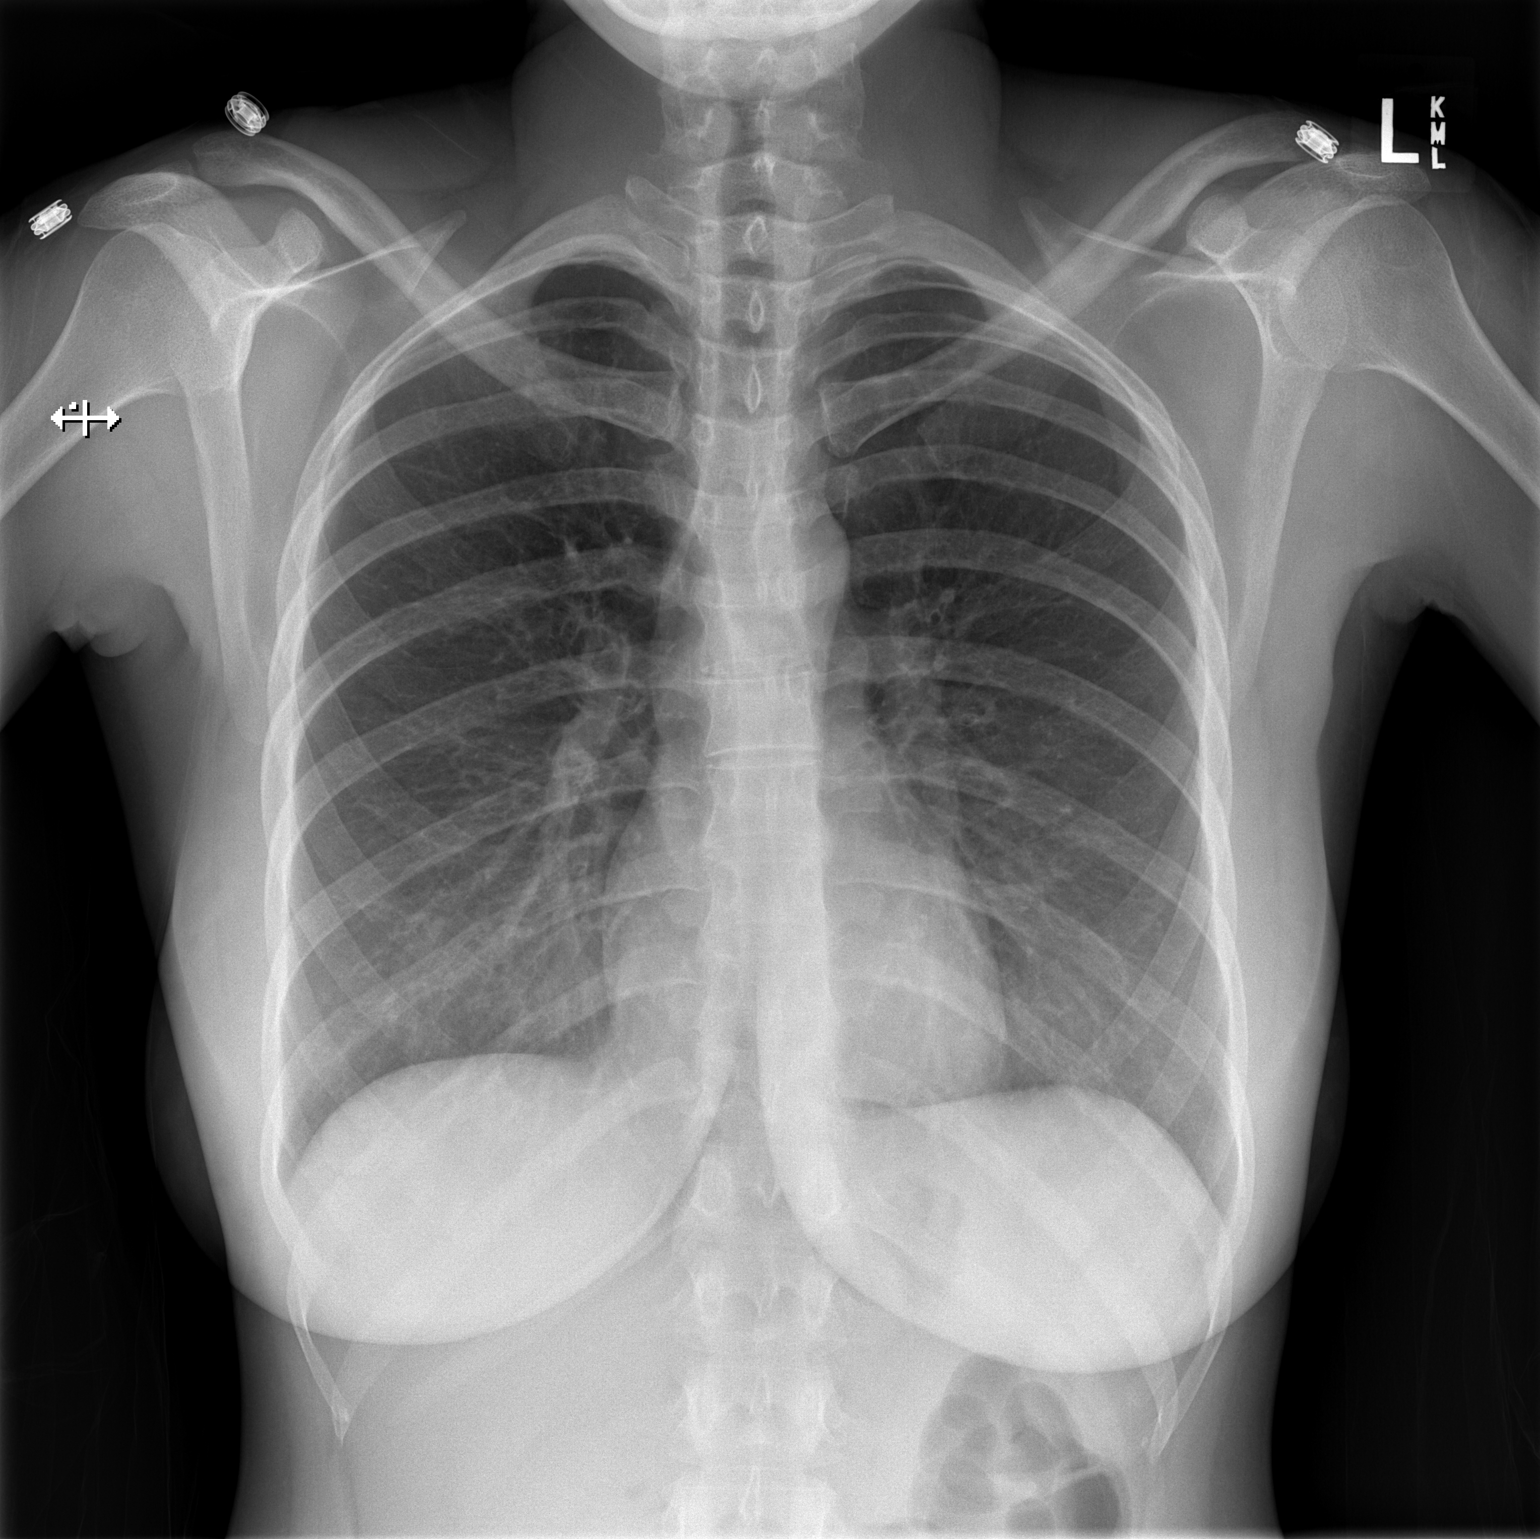

[w chest lat]
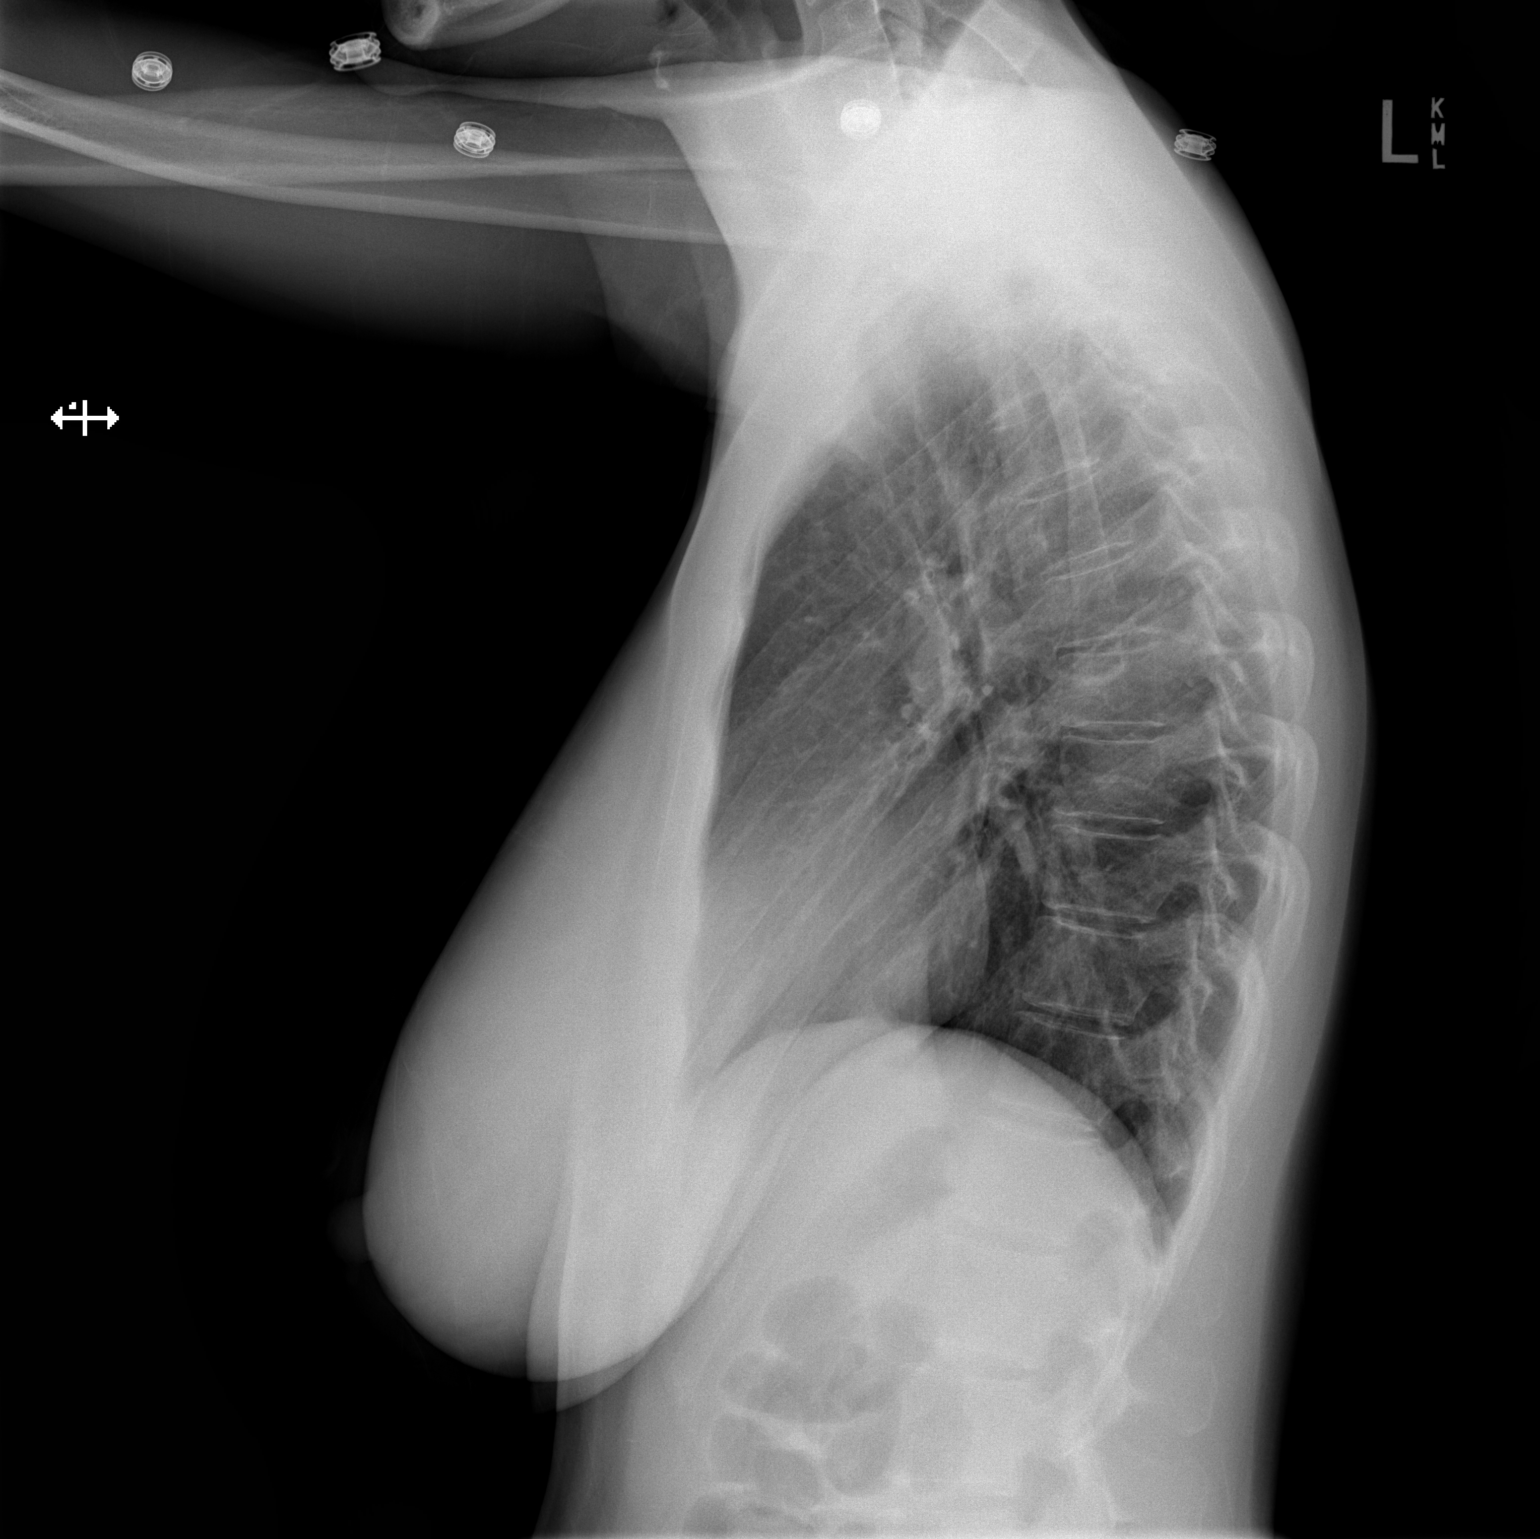

[2 of 2 positions shown; findings below may reference images not displayed]

FINDINGS: Normal sized heart. Clear lungs. Minimal peribronchial thickening.
Unremarkable bones.
IMPRESSION: Minimal bronchitic changes.

## 2020-06-11 ENCOUNTER — Other Ambulatory Visit: Payer: Self-pay

## 2020-06-11 NOTE — Telephone Encounter (Signed)
[facility] sent in refill request for patient. Rx request denies because patient has not had yearly exam in over a year and has not had a depo injection in the office since June of 2021. Rx request sent to pharmacy with denial.

## 2020-09-25 ENCOUNTER — Other Ambulatory Visit: Payer: Self-pay | Admitting: Obstetrics

## 2020-10-29 ENCOUNTER — Other Ambulatory Visit: Payer: Self-pay | Admitting: Obstetrics
